# Patient Record
Sex: Female | Born: 1945 | Race: White | Hispanic: No | Marital: Married | Smoking: Never smoker
Health system: Southern US, Community
[De-identification: ages and names within clinical notes are randomized; demographics above are authoritative.]

## PROBLEM LIST (undated history)

## (undated) DIAGNOSIS — C4491 Basal cell carcinoma of skin, unspecified: Secondary | ICD-10-CM

## (undated) DIAGNOSIS — G709 Myoneural disorder, unspecified: Secondary | ICD-10-CM

## (undated) DIAGNOSIS — I10 Essential (primary) hypertension: Secondary | ICD-10-CM

## (undated) DIAGNOSIS — Z7982 Long term (current) use of aspirin: Secondary | ICD-10-CM

## (undated) DIAGNOSIS — M4800 Spinal stenosis, site unspecified: Secondary | ICD-10-CM

## (undated) DIAGNOSIS — D649 Anemia, unspecified: Secondary | ICD-10-CM

## (undated) DIAGNOSIS — C801 Malignant (primary) neoplasm, unspecified: Secondary | ICD-10-CM

## (undated) DIAGNOSIS — E7800 Pure hypercholesterolemia, unspecified: Secondary | ICD-10-CM

## (undated) DIAGNOSIS — E119 Type 2 diabetes mellitus without complications: Secondary | ICD-10-CM

## (undated) DIAGNOSIS — N189 Chronic kidney disease, unspecified: Secondary | ICD-10-CM

## (undated) DIAGNOSIS — M1990 Unspecified osteoarthritis, unspecified site: Secondary | ICD-10-CM

## (undated) DIAGNOSIS — K219 Gastro-esophageal reflux disease without esophagitis: Secondary | ICD-10-CM

## (undated) HISTORY — PX: KNEE ARTHROSCOPY: SHX127

## (undated) HISTORY — PX: ESOPHAGOGASTRODUODENOSCOPY: SHX1529

## (undated) HISTORY — PX: KNEE ARTHROPLASTY: SHX992

## (undated) HISTORY — PX: COLONOSCOPY: SHX174

## (undated) HISTORY — PX: APPENDECTOMY: SHX54

## (undated) HISTORY — PX: TOTAL KNEE ARTHROPLASTY: SHX125

## (undated) HISTORY — PX: SHOULDER ARTHROSCOPY WITH ROTATOR CUFF REPAIR: SHX5685

---

## 2015-01-22 ENCOUNTER — Ambulatory Visit: Payer: Self-pay | Admitting: Physician Assistant

## 2015-08-11 ENCOUNTER — Encounter: Payer: Self-pay | Admitting: Gynecology

## 2015-08-11 ENCOUNTER — Ambulatory Visit
Admission: EM | Admit: 2015-08-11 | Discharge: 2015-08-11 | Disposition: A | Discharge status: Home / Self Care | Payer: Medicare Other | Attending: Internal Medicine | Admitting: Internal Medicine

## 2015-08-11 DIAGNOSIS — I10 Essential (primary) hypertension: Secondary | ICD-10-CM | POA: Insufficient documentation

## 2015-08-11 DIAGNOSIS — N3001 Acute cystitis with hematuria: Secondary | ICD-10-CM | POA: Diagnosis not present

## 2015-08-11 DIAGNOSIS — Z7982 Long term (current) use of aspirin: Secondary | ICD-10-CM | POA: Insufficient documentation

## 2015-08-11 DIAGNOSIS — R3915 Urgency of urination: Secondary | ICD-10-CM | POA: Diagnosis present

## 2015-08-11 DIAGNOSIS — Z79899 Other long term (current) drug therapy: Secondary | ICD-10-CM | POA: Diagnosis not present

## 2015-08-11 DIAGNOSIS — E119 Type 2 diabetes mellitus without complications: Secondary | ICD-10-CM | POA: Diagnosis not present

## 2015-08-11 DIAGNOSIS — E7800 Pure hypercholesterolemia, unspecified: Secondary | ICD-10-CM | POA: Insufficient documentation

## 2015-08-11 DIAGNOSIS — K219 Gastro-esophageal reflux disease without esophagitis: Secondary | ICD-10-CM | POA: Insufficient documentation

## 2015-08-11 HISTORY — DX: Gastro-esophageal reflux disease without esophagitis: K21.9

## 2015-08-11 HISTORY — DX: Essential (primary) hypertension: I10

## 2015-08-11 HISTORY — DX: Unspecified osteoarthritis, unspecified site: M19.90

## 2015-08-11 HISTORY — DX: Type 2 diabetes mellitus without complications: E11.9

## 2015-08-11 HISTORY — DX: Pure hypercholesterolemia, unspecified: E78.00

## 2015-08-11 LAB — URINALYSIS COMPLETE WITH MICROSCOPIC (ARMC ONLY)
PH: 0 — AB (ref 5.0–8.0)
Squamous Epithelial / LPF: NONE SEEN — AB

## 2015-08-11 MED ORDER — NITROFURANTOIN MONOHYD MACRO 100 MG PO CAPS: 100.0000 mg | ORAL_CAPSULE | Freq: Two times a day (BID) | ORAL | Status: DC

## 2015-08-11 MED ORDER — PHENAZOPYRIDINE HCL 200 MG PO TABS: 200.0000 mg | ORAL_TABLET | Freq: Three times a day (TID) | ORAL | Status: DC

## 2015-08-11 NOTE — ED Notes (Signed)
Urinating blood, has had small clots last night. Painful urination since yesterday. No fever.

## 2015-08-11 NOTE — ED Provider Notes (Signed)
CSN: [id number]     Arrival date & time 08/11/15  0804 History   First MD Initiated Contact with Patient 08/11/15 [phone]     Chief Complaint  Patient presents with  . Urinary Tract Infection    Patient is a 69 y.o. female presenting with urinary tract infection.  Urinary Tract Infection  she reports that she has had urinary tract infections in the past, most recently in May 2015. She had the onset last evening of urinary urgency, frequency, burning. She was very uncomfortable, but was able to take a dose of over-the-counter Pyridium and get some rest. No fever, no nausea/vomiting/diarrhea. Appetite is okay. No unusual vaginal discharge or bleeding. No malaise.   Past Medical History  Diagnosis Date  . Diabetes mellitus without complication ([place])   . Hypertension   . Hypercholesteremia   . GERD (gastroesophageal reflux disease)   . Arthritis    Past Surgical History  Procedure Laterality Date  . Appendectomy    . Knee arthroplasty      right  . Total knee arthroplasty    . Shoulder arthroscopy with rotator cuff repair      right   Family history: Diabetes, hypertension  Social History  patient is a retired Producer, television/film/video, worked in Building surveyor for 35 years   Substance Use Topics  . Smoking status: Never Smoker   . Smokeless tobacco: None  . Alcohol Use: Yes    Review of Systems  All other systems reviewed and are negative.   Allergies  Dilaudid  Home Medications   Prior to Admission medications   Medication Sig Start Date End Date Taking? Authorizing Provider  aspirin 81 MG tablet Take 81 mg by mouth daily.   Yes Historical Provider, MD  calcium-vitamin D (OSCAL WITH D) 500-200 MG-UNIT tablet Take 1 tablet by mouth.   Yes Historical Provider, MD  co-enzyme Q-10 30 MG capsule Take 30 mg by mouth 3 (three) times daily.   Yes Historical Provider, MD  esomeprazole (NEXIUM) 40 MG capsule Take 40 mg by mouth daily at 12 noon.   Yes Historical Provider, MD  glucosamine-chondroitin  500-400 MG tablet Take 1 tablet by mouth 3 (three) times daily.   Yes Historical Provider, MD  Liraglutide (VICTOZA) 18 MG/3ML SOPN Inject into the skin.   Yes Historical Provider, MD  lisinopril (PRINIVIL,ZESTRIL) 40 MG tablet Take 40 mg by mouth daily.   Yes Historical Provider, MD  lovastatin (MEVACOR) 20 MG tablet Take 20 mg by mouth at bedtime.   Yes Historical Provider, MD  metFORMIN (GLUCOPHAGE) 1000 MG tablet Take 1,000 mg by mouth 2 (two) times daily with a meal.   Yes Historical Provider, MD  sitaGLIPtin (JANUVIA) 100 MG tablet Take 100 mg by mouth daily.   Yes Historical Provider, MD  vitamin B-12 (CYANOCOBALAMIN) 100 MCG tablet Take 100 mcg by mouth daily.   Yes Historical Provider, MD  nitrofurantoin, macrocrystal-monohydrate, (MACROBID) 100 MG capsule Take 1 capsule (100 mg total) by mouth 2 (two) times daily. 08/11/15   Sherlene Shams, MD  phenazopyridine (PYRIDIUM) 200 MG tablet Take 1 tablet (200 mg total) by mouth 3 (three) times daily. 08/11/15   Sherlene Shams, MD   Meds Ordered and Administered this Visit  Medications - No data to display  BP 150/73 mmHg  Pulse 96  Temp(Src) 98 F (36.7 C) (Oral)  Ht 5\' 3"  (1.6 m)  Wt 230 lb (104.327 kg)  BMI 40.75 kg/m2  SpO2 97% No data found.  Physical Exam  Constitutional: She is oriented to person, place, and time. No distress.  Alert, nicely groomed  HENT:  Head: Atraumatic.  Eyes:  Conjugate gaze, no eye redness/drainage  Neck: Neck supple.  Cardiovascular: Normal rate.   Pulmonary/Chest: No respiratory distress.  Abdominal: She exhibits no distension.  Musculoskeletal: She exhibits no edema.  No leg swelling  Neurological: She is alert and oriented to person, place, and time.  Skin: Skin is warm and dry.  Pink. No cyanosis  Nursing note and vitals reviewed.   ED Course  Procedures (including critical care time)  Results for orders placed or performed during the hospital encounter of 08/11/15  Urinalysis  complete, with microscopic  Result Value Ref Range   Color, Urine ORANGE (A) YELLOW   APPearance CLOUDY (A) CLEAR   Glucose, UA (A) NEGATIVE mg/dL    TEST NOT REPORTED DUE TO COLOR INTERFERENCE OF URINE PIGMENT   Bilirubin Urine (A) NEGATIVE    TEST NOT REPORTED DUE TO COLOR INTERFERENCE OF URINE PIGMENT   Ketones, ur (A) NEGATIVE mg/dL    TEST NOT REPORTED DUE TO COLOR INTERFERENCE OF URINE PIGMENT   Specific Gravity, Urine  1.005 - 1.030    TEST NOT REPORTED DUE TO COLOR INTERFERENCE OF URINE PIGMENT   Hgb urine dipstick (A) NEGATIVE    TEST NOT REPORTED DUE TO COLOR INTERFERENCE OF URINE PIGMENT   pH 0.0 (L) 5.0 - 8.0   Protein, ur (A) NEGATIVE mg/dL    TEST NOT REPORTED DUE TO COLOR INTERFERENCE OF URINE PIGMENT   Nitrite (A) NEGATIVE    TEST NOT REPORTED DUE TO COLOR INTERFERENCE OF URINE PIGMENT   Leukocytes, UA (A) NEGATIVE    TEST NOT REPORTED DUE TO COLOR INTERFERENCE OF URINE PIGMENT   RBC / HPF TOO NUMEROUS TO COUNT <3 RBC/hpf   WBC, UA TOO NUMEROUS TO COUNT <3 WBC/hpf   Bacteria, UA FEW (A) RARE   Squamous Epithelial / LPF NONE SEEN (A) RARE    urine culture is pending  MDM   1. Acute hemorrhagic cystitis    Discharge Medication List as of 08/11/2015  8:46 AM    START taking these medications   Details  nitrofurantoin, macrocrystal-monohydrate, (MACROBID) 100 MG capsule Take 1 capsule (100 mg total) by mouth 2 (two) times daily., Starting 08/11/2015, Until Discontinued, Normal    phenazopyridine (PYRIDIUM) 200 MG tablet Take 1 tablet (200 mg total) by mouth 3 (three) times daily., Starting 08/11/2015, Until Discontinued, Normal       [facility] [place]. Recheck or follow up PCP/Dr. Hoy Morn for persistent or worsening symptoms.    Sherlene Shams, MD 08/11/15 [phone]

## 2015-08-11 NOTE — Discharge Instructions (Signed)
Urine culture is pending; urinalysis suggests a UTI. Prescriptions for macrobid (nitrofurantoin, an antibiotic) and pyridium (phenazopyridine) were [facility] [place].

## 2015-08-15 LAB — URINE CULTURE: Culture: >=100000

## 2018-01-12 ENCOUNTER — Other Ambulatory Visit: Payer: Self-pay | Admitting: Family Medicine

## 2018-01-12 DIAGNOSIS — Z780 Asymptomatic menopausal state: Secondary | ICD-10-CM

## 2021-07-11 ENCOUNTER — Other Ambulatory Visit: Payer: Self-pay | Admitting: Physical Medicine & Rehabilitation

## 2021-07-11 ENCOUNTER — Other Ambulatory Visit (HOSPITAL_COMMUNITY): Payer: Self-pay | Admitting: Physical Medicine & Rehabilitation

## 2021-07-11 DIAGNOSIS — G8929 Other chronic pain: Secondary | ICD-10-CM

## 2021-07-11 DIAGNOSIS — M5441 Lumbago with sciatica, right side: Secondary | ICD-10-CM

## 2021-07-23 ENCOUNTER — Other Ambulatory Visit: Payer: Self-pay

## 2021-07-23 ENCOUNTER — Ambulatory Visit
Admission: RE | Admit: 2021-07-23 | Discharge: 2021-07-23 | Disposition: A | Discharge status: Home / Self Care | Payer: Medicare Other | Source: Ambulatory Visit | Attending: Physical Medicine & Rehabilitation | Admitting: Physical Medicine & Rehabilitation

## 2021-07-23 DIAGNOSIS — G8929 Other chronic pain: Secondary | ICD-10-CM | POA: Diagnosis present

## 2021-07-23 DIAGNOSIS — M5441 Lumbago with sciatica, right side: Secondary | ICD-10-CM | POA: Insufficient documentation

## 2021-07-23 DIAGNOSIS — M5442 Lumbago with sciatica, left side: Secondary | ICD-10-CM | POA: Diagnosis present

## 2021-11-20 ENCOUNTER — Other Ambulatory Visit: Payer: Self-pay | Admitting: Neurosurgery

## 2021-11-20 DIAGNOSIS — Z01818 Encounter for other preprocedural examination: Secondary | ICD-10-CM

## 2021-11-29 ENCOUNTER — Encounter
Admission: RE | Admit: 2021-11-29 | Discharge: 2021-11-29 | Disposition: A | Discharge status: Home / Self Care | Payer: Medicare Other | Source: Ambulatory Visit | Attending: Neurosurgery | Admitting: Neurosurgery

## 2021-11-29 ENCOUNTER — Other Ambulatory Visit: Payer: Self-pay

## 2021-11-29 DIAGNOSIS — Z01818 Encounter for other preprocedural examination: Secondary | ICD-10-CM | POA: Diagnosis not present

## 2021-11-29 HISTORY — DX: Chronic kidney disease, unspecified: N18.9

## 2021-11-29 HISTORY — DX: Myoneural disorder, unspecified: G70.9

## 2021-11-29 HISTORY — DX: Anemia, unspecified: D64.9

## 2021-11-29 HISTORY — DX: Malignant (primary) neoplasm, unspecified: C80.1

## 2021-11-29 LAB — CBC
HCT: 39.4 % (ref 36.0–46.0)
Hemoglobin: 13.4 g/dL (ref 12.0–15.0)
MCH: 29.1 pg (ref 26.0–34.0)
MCHC: 34 g/dL (ref 30.0–36.0)
MCV: 85.5 fL (ref 80.0–100.0)
Platelets: 275 10*3/uL (ref 150–400)
RBC: 4.61 MIL/uL (ref 3.87–5.11)
RDW: 13 % (ref 11.5–15.5)
WBC: 9.2 10*3/uL (ref 4.0–10.5)
nRBC: 0 % (ref 0.0–0.2)

## 2021-11-29 LAB — URINALYSIS, ROUTINE W REFLEX MICROSCOPIC
Bilirubin Urine: NEGATIVE
Glucose, UA: NEGATIVE mg/dL
Hgb urine dipstick: NEGATIVE
Ketones, ur: NEGATIVE mg/dL
Nitrite: NEGATIVE
Protein, ur: NEGATIVE mg/dL
Specific Gravity, Urine: 1.019 (ref 1.005–1.030)
pH: 5 (ref 5.0–8.0)

## 2021-11-29 LAB — BASIC METABOLIC PANEL
Anion gap: 9 (ref 5–15)
BUN: 21 mg/dL (ref 8–23)
CO2: 26 mmol/L (ref 22–32)
Calcium: 10.1 mg/dL (ref 8.9–10.3)
Chloride: 102 mmol/L (ref 98–111)
Creatinine, Ser: 0.85 mg/dL (ref 0.44–1.00)
GFR, Estimated: >60 mL/min (ref >60)
Glucose, Bld: 201 mg/dL — ABNORMAL HIGH (ref 70–99)
Potassium: 4.1 mmol/L (ref 3.5–5.1)
Sodium: 137 mmol/L (ref 135–145)

## 2021-11-29 LAB — SURGICAL PCR SCREEN
MRSA, PCR: NEGATIVE
Staphylococcus aureus: NEGATIVE

## 2021-11-29 LAB — TYPE AND SCREEN
ABO/RH(D): A POS
Antibody Screen: NEGATIVE

## 2021-11-29 LAB — APTT: aPTT: 27 seconds (ref 24–36)

## 2021-11-29 LAB — PROTIME-INR
INR: 1 (ref 0.8–1.2)
Prothrombin Time: 13 seconds (ref 11.4–15.2)

## 2021-11-29 NOTE — Patient Instructions (Addendum)
Your procedure is scheduled on: 12/13/21 - Friday Report to the Registration Desk on the 1st floor of the [facility]. To find out your arrival time, please call [phone] between 1PM - 3PM on: 12/12/21 - Thursday  REMEMBER: Instructions that are not followed completely may result in serious medical risk, up to and including death; or upon the discretion of your surgeon and anesthesiologist your surgery may need to be rescheduled.  Do not eat food after midnight the night before surgery.  No gum chewing, lozengers or hard candies.  You may however, drink CLEAR liquids up to 2 hours before you are scheduled to arrive for your surgery. Do not drink anything within 2 hours of your scheduled arrival time.  Clear liquids include: - water  Type 1 and Type 2 diabetics should only drink water.  TAKE THESE MEDICATIONS THE MORNING OF SURGERY WITH A SIP OF WATER:   - famotidine (PEPCID) 10 MG tablet- take 1 tablet the night before surgery and take 1 tablet the morning of surgery.  Follow recommendations from Cardiologist, Pulmonologist or PCP regarding stopping Aspirin, Coumadin, Plavix, Eliquis, Pradaxa, or Pletal. Stop taking Aspirin 81 mg beginning 12/06/21, may resume 2 weeks after your surgery.  Do Not take MetFORMIN on 02/01, 02/02, and do not take the day of surgery.  One week prior to surgery: Stop Anti-inflammatories (NSAIDS) such as Advil, Aleve, Ibuprofen, Motrin, Naproxen, Naprosyn and Aspirin based products such as Excedrin, Goodys Powder, BC Powder.  Stop ANY OVER THE COUNTER supplements until after surgery. Beginning 12/06/21  You may however, continue to take Tylenol if needed for pain up until the day of surgery.  No Alcohol for 24 hours before or after surgery.  No Smoking including e-cigarettes for 24 hours prior to surgery.  No chewable tobacco products for at least 6 hours prior to surgery.  No nicotine patches on the day of surgery.  Do not use any "recreational"  drugs for at least a week prior to your surgery.  Please be advised that the combination of cocaine and anesthesia may have negative outcomes, up to and including death. If you test positive for cocaine, your surgery will be cancelled.  On the morning of surgery brush your teeth with toothpaste and water, you may rinse your mouth with mouthwash if you wish. Do not swallow any toothpaste or mouthwash.  Use CHG Soap or wipes as directed on instruction sheet.  Do not wear jewelry, make-up, hairpins, clips or nail polish.  Do not wear lotions, powders, or perfumes.   Do not shave body from the neck down 48 hours prior to surgery just in case you cut yourself which could leave a site for infection.  Also, freshly shaved skin may become irritated if using the CHG soap.  Contact lenses, hearing aids and dentures may not be worn into surgery.  Do not bring valuables to the hospital. [facility] is not responsible for any missing/lost belongings or valuables.   Notify your doctor if there is any change in your medical condition (cold, fever, infection).  Wear comfortable clothing (specific to your surgery type) to the hospital.  After surgery, you can help prevent lung complications by doing breathing exercises.  Take deep breaths and cough every 1-2 hours. Your doctor may order a device called an Incentive Spirometer to help you take deep breaths. When coughing or sneezing, hold a pillow firmly against your incision with both hands. This is called splinting. Doing this helps protect your incision. It also  decreases belly discomfort.  If you are being admitted to the hospital overnight, leave your suitcase in the car. After surgery it may be brought to your room.  If you are being discharged the day of surgery, you will not be allowed to drive home. You will need a responsible adult (18 years or older) to drive you home and stay with you that night.   If you are taking public  transportation, you will need to have a responsible adult (18 years or older) with you. Please confirm with your physician that it is acceptable to use public transportation.   Please call the [facility]. at [phone] if you have any questions about these instructions.  Surgery Visitation Policy:  Patients undergoing a surgery or procedure may have one family member or support person with them as long as that person is not COVID-19 positive or experiencing its symptoms.  That person may remain in the waiting area during the procedure and may rotate out with other people.  Inpatient Visitation:    Visiting hours are 7 a.m. to 8 p.m. Up to two visitors ages 16+ are allowed at one time in a patient room. The visitors may rotate out with other people during the day. Visitors must check out when they leave, or other visitors will not be allowed. One designated support person may remain overnight. The visitor must pass COVID-19 screenings, use hand sanitizer when entering and exiting the patients room and wear a mask at all times, including in the patients room. Patients must also wear a mask when staff or their visitor are in the room. Masking is required regardless of vaccination status.

## 2021-12-13 ENCOUNTER — Encounter: Payer: Self-pay | Admitting: Neurosurgery

## 2021-12-13 ENCOUNTER — Observation Stay
Admission: RE | Admit: 2021-12-13 | Discharge: 2021-12-14 | Disposition: A | Discharge status: Home / Self Care | Payer: Medicare Other | Source: Ambulatory Visit | Attending: Neurosurgery | Admitting: Neurosurgery

## 2021-12-13 ENCOUNTER — Ambulatory Visit: Payer: Medicare Other

## 2021-12-13 ENCOUNTER — Other Ambulatory Visit: Payer: Self-pay

## 2021-12-13 ENCOUNTER — Ambulatory Visit: Payer: Medicare Other | Admitting: Anesthesiology

## 2021-12-13 ENCOUNTER — Ambulatory Visit: Payer: Medicare Other | Admitting: Urgent Care

## 2021-12-13 ENCOUNTER — Encounter
Admission: RE | Disposition: A | Discharge status: Home / Self Care | Payer: Self-pay | Source: Ambulatory Visit | Attending: Neurosurgery

## 2021-12-13 DIAGNOSIS — Z91040 Latex allergy status: Secondary | ICD-10-CM | POA: Insufficient documentation

## 2021-12-13 DIAGNOSIS — M48062 Spinal stenosis, lumbar region with neurogenic claudication: Secondary | ICD-10-CM | POA: Diagnosis present

## 2021-12-13 DIAGNOSIS — E119 Type 2 diabetes mellitus without complications: Secondary | ICD-10-CM | POA: Diagnosis not present

## 2021-12-13 DIAGNOSIS — M5416 Radiculopathy, lumbar region: Secondary | ICD-10-CM | POA: Diagnosis present

## 2021-12-13 DIAGNOSIS — Z419 Encounter for procedure for purposes other than remedying health state, unspecified: Secondary | ICD-10-CM

## 2021-12-13 HISTORY — PX: LUMBAR LAMINECTOMY/DECOMPRESSION MICRODISCECTOMY: SHX5026

## 2021-12-13 LAB — GLUCOSE, CAPILLARY
Glucose-Capillary: 154 mg/dL — ABNORMAL HIGH (ref 70–99)
Glucose-Capillary: 123 mg/dL — ABNORMAL HIGH (ref 70–99)

## 2021-12-13 LAB — ABO/RH: ABO/RH(D): A POS

## 2021-12-13 SURGERY — LUMBAR LAMINECTOMY/DECOMPRESSION MICRODISCECTOMY 2 LEVELS
Anesthesia: General | Site: Spine Lumbar

## 2021-12-13 MED ORDER — BUPIVACAINE LIPOSOME 1.3 % IJ SUSP
INTRAMUSCULAR | Status: AC
  Filled 2021-12-13: qty 20

## 2021-12-13 MED ORDER — SURGIFLO WITH THROMBIN (HEMOSTATIC MATRIX KIT) OPTIME
TOPICAL | Status: DC | PRN
  Administered 2021-12-13: 1 via TOPICAL

## 2021-12-13 MED ORDER — LIDOCAINE HCL (PF) 2 % IJ SOLN
INTRAMUSCULAR | Status: AC
  Filled 2021-12-13: qty 5

## 2021-12-13 MED ORDER — SODIUM CHLORIDE 0.9% FLUSH
3.0000 mL | Freq: Two times a day (BID) | INTRAVENOUS | Status: DC
  Administered 2021-12-13 – 2021-12-14 (×3): 3 mL via INTRAVENOUS

## 2021-12-13 MED ORDER — PHENOL 1.4 % MT LIQD
1.0000 | OROMUCOSAL | Status: DC | PRN
  Filled 2021-12-13: qty 177

## 2021-12-13 MED ORDER — SODIUM CHLORIDE 0.9 % IV SOLN: INTRAVENOUS | Status: DC

## 2021-12-13 MED ORDER — CHLORHEXIDINE GLUCONATE 0.12 % MT SOLN
OROMUCOSAL | Status: AC
  Administered 2021-12-13: 15 mL via OROMUCOSAL
  Filled 2021-12-13: qty 15

## 2021-12-13 MED ORDER — BUPIVACAINE HCL (PF) 0.5 % IJ SOLN
INTRAMUSCULAR | Status: AC
  Filled 2021-12-13: qty 30

## 2021-12-13 MED ORDER — BUPIVACAINE-EPINEPHRINE (PF) 0.5% -1:200000 IJ SOLN
INTRAMUSCULAR | Status: DC | PRN
  Administered 2021-12-13: 10 mL via PERINEURAL

## 2021-12-13 MED ORDER — FENTANYL CITRATE (PF) 100 MCG/2ML IJ SOLN
25.0000 ug | INTRAMUSCULAR | Status: DC | PRN
  Administered 2021-12-13 (×2): 25 ug via INTRAVENOUS

## 2021-12-13 MED ORDER — CEFAZOLIN SODIUM-DEXTROSE 2-4 GM/100ML-% IV SOLN
2.0000 g | INTRAVENOUS | Status: AC
  Administered 2021-12-13: 2 g via INTRAVENOUS

## 2021-12-13 MED ORDER — METHYLPREDNISOLONE ACETATE 40 MG/ML IJ SUSP
INTRAMUSCULAR | Status: DC | PRN
Start: 2021-12-13 — End: 2021-12-13
  Administered 2021-12-13: 40 mg

## 2021-12-13 MED ORDER — ACETAMINOPHEN 10 MG/ML IV SOLN
INTRAVENOUS | Status: AC
  Filled 2021-12-13: qty 100

## 2021-12-13 MED ORDER — PHENYLEPHRINE HCL (PRESSORS) 10 MG/ML IV SOLN
INTRAVENOUS | Status: DC | PRN
  Administered 2021-12-13 (×2): 80 ug via INTRAVENOUS
  Administered 2021-12-13: 160 ug via INTRAVENOUS
  Administered 2021-12-13 (×2): 80 ug via INTRAVENOUS

## 2021-12-13 MED ORDER — ONDANSETRON HCL 4 MG PO TABS: 4.0000 mg | ORAL_TABLET | Freq: Four times a day (QID) | ORAL | Status: DC | PRN

## 2021-12-13 MED ORDER — ONDANSETRON HCL 4 MG/2ML IJ SOLN
INTRAMUSCULAR | Status: AC
  Filled 2021-12-13: qty 2

## 2021-12-13 MED ORDER — MENTHOL 3 MG MT LOZG
1.0000 | LOZENGE | OROMUCOSAL | Status: DC | PRN
  Filled 2021-12-13: qty 9

## 2021-12-13 MED ORDER — METFORMIN HCL ER 500 MG PO TB24
1000.0000 mg | ORAL_TABLET | Freq: Two times a day (BID) | ORAL | Status: DC
  Administered 2021-12-13 – 2021-12-14 (×2): 1000 mg via ORAL
  Filled 2021-12-13 (×4): qty 2

## 2021-12-13 MED ORDER — OXYCODONE HCL 5 MG PO TABS
10.0000 mg | ORAL_TABLET | ORAL | Status: DC | PRN
  Administered 2021-12-13: 10 mg via ORAL
  Filled 2021-12-13: qty 2

## 2021-12-13 MED ORDER — 0.9 % SODIUM CHLORIDE (POUR BTL) OPTIME
TOPICAL | Status: DC | PRN
  Administered 2021-12-13: 100 mL

## 2021-12-13 MED ORDER — SUCCINYLCHOLINE CHLORIDE 200 MG/10ML IV SOSY
PREFILLED_SYRINGE | INTRAVENOUS | Status: DC | PRN
Start: 2021-12-13 — End: 2021-12-13
  Administered 2021-12-13: 100 mg via INTRAVENOUS

## 2021-12-13 MED ORDER — ACETAMINOPHEN 10 MG/ML IV SOLN: 1000.0000 mg | Freq: Once | INTRAVENOUS | Status: DC | PRN

## 2021-12-13 MED ORDER — CHLORHEXIDINE GLUCONATE 0.12 % MT SOLN
15.0000 mL | Freq: Once | OROMUCOSAL | Status: AC
Start: 2021-12-13 — End: 2021-12-13

## 2021-12-13 MED ORDER — OXYCODONE HCL 5 MG PO TABS
5.0000 mg | ORAL_TABLET | ORAL | Status: DC | PRN
  Administered 2021-12-13 – 2021-12-14 (×3): 5 mg via ORAL
  Filled 2021-12-13 (×4): qty 1

## 2021-12-13 MED ORDER — FENTANYL CITRATE (PF) 100 MCG/2ML IJ SOLN
INTRAMUSCULAR | Status: AC
  Filled 2021-12-13: qty 2

## 2021-12-13 MED ORDER — GLIMEPIRIDE 4 MG PO TABS
4.0000 mg | ORAL_TABLET | Freq: Every day | ORAL | Status: DC | PRN
  Filled 2021-12-13: qty 1

## 2021-12-13 MED ORDER — PHENYLEPHRINE HCL-NACL 20-0.9 MG/250ML-% IV SOLN
INTRAVENOUS | Status: AC
  Filled 2021-12-13: qty 250

## 2021-12-13 MED ORDER — REMIFENTANIL HCL 1 MG IV SOLR
INTRAVENOUS | Status: DC | PRN
  Administered 2021-12-13: .2 ug/kg/min via INTRAVENOUS

## 2021-12-13 MED ORDER — REMIFENTANIL HCL 1 MG IV SOLR
INTRAVENOUS | Status: AC
  Filled 2021-12-13: qty 1000

## 2021-12-13 MED ORDER — B COMPLEX-C PO TABS
1.0000 | ORAL_TABLET | Freq: Every day | ORAL | Status: DC
  Administered 2021-12-13 – 2021-12-14 (×2): 1 via ORAL
  Filled 2021-12-13 (×2): qty 1

## 2021-12-13 MED ORDER — SODIUM CHLORIDE 0.9% FLUSH: 3.0000 mL | INTRAVENOUS | Status: DC | PRN

## 2021-12-13 MED ORDER — SODIUM CHLORIDE FLUSH 0.9 % IV SOLN
INTRAVENOUS | Status: AC
  Filled 2021-12-13: qty 20

## 2021-12-13 MED ORDER — OXYCODONE HCL 5 MG/5ML PO SOLN: 5.0000 mg | Freq: Once | ORAL | Status: DC | PRN

## 2021-12-13 MED ORDER — SENNA 8.6 MG PO TABS
1.0000 | ORAL_TABLET | Freq: Two times a day (BID) | ORAL | Status: DC
  Administered 2021-12-13 – 2021-12-14 (×3): 8.6 mg via ORAL
  Filled 2021-12-13 (×3): qty 1

## 2021-12-13 MED ORDER — MORPHINE SULFATE (PF) 2 MG/ML IV SOLN
2.0000 mg | INTRAVENOUS | Status: DC | PRN
  Filled 2021-12-13: qty 1

## 2021-12-13 MED ORDER — LISINOPRIL 10 MG PO TABS
10.0000 mg | ORAL_TABLET | Freq: Every day | ORAL | Status: DC
  Administered 2021-12-13 – 2021-12-14 (×2): 10 mg via ORAL
  Filled 2021-12-13 (×2): qty 1

## 2021-12-13 MED ORDER — MIDAZOLAM HCL 2 MG/2ML IJ SOLN
INTRAMUSCULAR | Status: AC
  Filled 2021-12-13: qty 2

## 2021-12-13 MED ORDER — CELECOXIB 200 MG PO CAPS
200.0000 mg | ORAL_CAPSULE | Freq: Two times a day (BID) | ORAL | Status: DC
  Administered 2021-12-13 – 2021-12-14 (×3): 200 mg via ORAL
  Filled 2021-12-13 (×3): qty 1

## 2021-12-13 MED ORDER — ACETAMINOPHEN 10 MG/ML IV SOLN
INTRAVENOUS | Status: DC | PRN
  Administered 2021-12-13: 1000 mg via INTRAVENOUS

## 2021-12-13 MED ORDER — ONDANSETRON HCL 4 MG/2ML IJ SOLN
4.0000 mg | Freq: Four times a day (QID) | INTRAMUSCULAR | Status: DC | PRN
  Administered 2021-12-14: 4 mg via INTRAVENOUS
  Filled 2021-12-13: qty 2

## 2021-12-13 MED ORDER — PROPOFOL 500 MG/50ML IV EMUL
INTRAVENOUS | Status: DC | PRN
  Administered 2021-12-13: 125 ug/kg/min via INTRAVENOUS

## 2021-12-13 MED ORDER — SUCCINYLCHOLINE CHLORIDE 200 MG/10ML IV SOSY
PREFILLED_SYRINGE | INTRAVENOUS | Status: AC
  Filled 2021-12-13: qty 10

## 2021-12-13 MED ORDER — BISACODYL 5 MG PO TBEC
5.0000 mg | DELAYED_RELEASE_TABLET | Freq: Every day | ORAL | Status: DC | PRN
  Filled 2021-12-13: qty 1

## 2021-12-13 MED ORDER — CEFAZOLIN SODIUM-DEXTROSE 2-4 GM/100ML-% IV SOLN
INTRAVENOUS | Status: AC
  Filled 2021-12-13: qty 100

## 2021-12-13 MED ORDER — BUPIVACAINE-EPINEPHRINE (PF) 0.5% -1:200000 IJ SOLN
INTRAMUSCULAR | Status: AC
  Filled 2021-12-13: qty 30

## 2021-12-13 MED ORDER — ONDANSETRON HCL 4 MG/2ML IJ SOLN
INTRAMUSCULAR | Status: DC | PRN
  Administered 2021-12-13: 4 mg via INTRAVENOUS

## 2021-12-13 MED ORDER — PROMETHAZINE HCL 25 MG/ML IJ SOLN: 6.2500 mg | INTRAMUSCULAR | Status: DC | PRN

## 2021-12-13 MED ORDER — ACETAMINOPHEN 500 MG PO TABS
1000.0000 mg | ORAL_TABLET | Freq: Four times a day (QID) | ORAL | Status: AC
  Administered 2021-12-13 – 2021-12-14 (×3): 1000 mg via ORAL
  Filled 2021-12-13 (×3): qty 2

## 2021-12-13 MED ORDER — ACETYL L-CARNITINE 500 MG PO CAPS: 500.0000 mg | ORAL_CAPSULE | Freq: Every day | ORAL | Status: DC

## 2021-12-13 MED ORDER — DROPERIDOL 2.5 MG/ML IJ SOLN
0.6250 mg | Freq: Once | INTRAMUSCULAR | Status: DC | PRN
  Filled 2021-12-13: qty 2

## 2021-12-13 MED ORDER — POLYETHYLENE GLYCOL 3350 17 G PO PACK
17.0000 g | PACK | Freq: Every day | ORAL | Status: DC | PRN
  Filled 2021-12-13: qty 1

## 2021-12-13 MED ORDER — FLEET ENEMA 7-19 GM/118ML RE ENEM: 1.0000 | ENEMA | Freq: Once | RECTAL | Status: DC | PRN

## 2021-12-13 MED ORDER — SODIUM CHLORIDE (PF) 0.9 % IJ SOLN
INTRAMUSCULAR | Status: DC | PRN
  Administered 2021-12-13: 60 mL

## 2021-12-13 MED ORDER — PROPOFOL 1000 MG/100ML IV EMUL
INTRAVENOUS | Status: AC
  Filled 2021-12-13: qty 100

## 2021-12-13 MED ORDER — VITAMIN D 25 MCG (1000 UNIT) PO TABS
2000.0000 [IU] | ORAL_TABLET | Freq: Every day | ORAL | Status: DC
  Administered 2021-12-13 – 2021-12-14 (×2): 2000 [IU] via ORAL
  Filled 2021-12-13 (×2): qty 2

## 2021-12-13 MED ORDER — PROPOFOL 10 MG/ML IV BOLUS
INTRAVENOUS | Status: AC
  Filled 2021-12-13: qty 20

## 2021-12-13 MED ORDER — EPHEDRINE SULFATE (PRESSORS) 50 MG/ML IJ SOLN
INTRAMUSCULAR | Status: DC | PRN
  Administered 2021-12-13: 5 mg via INTRAVENOUS
  Administered 2021-12-13: 10 mg via INTRAVENOUS

## 2021-12-13 MED ORDER — FAMOTIDINE 20 MG PO TABS: 10.0000 mg | ORAL_TABLET | Freq: Every evening | ORAL | Status: DC | PRN

## 2021-12-13 MED ORDER — PHENYLEPHRINE HCL-NACL 20-0.9 MG/250ML-% IV SOLN
INTRAVENOUS | Status: DC | PRN
Start: 2021-12-13 — End: 2021-12-13
  Administered 2021-12-13: 40 ug/min via INTRAVENOUS

## 2021-12-13 MED ORDER — MIDAZOLAM HCL 2 MG/2ML IJ SOLN
INTRAMUSCULAR | Status: DC | PRN
  Administered 2021-12-13: 2 mg via INTRAVENOUS

## 2021-12-13 MED ORDER — OXYCODONE HCL 5 MG PO TABS: 5.0000 mg | ORAL_TABLET | Freq: Once | ORAL | Status: DC | PRN

## 2021-12-13 MED ORDER — PROPOFOL 10 MG/ML IV BOLUS
INTRAVENOUS | Status: DC | PRN
  Administered 2021-12-13: 150 mg via INTRAVENOUS

## 2021-12-13 MED ORDER — SODIUM CHLORIDE 0.9 % IV SOLN: 250.0000 mL | INTRAVENOUS | Status: DC

## 2021-12-13 MED ORDER — METHYLPREDNISOLONE ACETATE 40 MG/ML IJ SUSP
INTRAMUSCULAR | Status: AC
  Filled 2021-12-13: qty 1

## 2021-12-13 MED ORDER — SODIUM CHLORIDE (PF) 0.9 % IJ SOLN
INTRAMUSCULAR | Status: AC
  Filled 2021-12-13: qty 20

## 2021-12-13 MED ORDER — TIZANIDINE HCL 4 MG PO TABS
4.0000 mg | ORAL_TABLET | Freq: Three times a day (TID) | ORAL | Status: DC | PRN
  Filled 2021-12-13: qty 1

## 2021-12-13 MED ORDER — ORAL CARE MOUTH RINSE: 15.0000 mL | Freq: Once | OROMUCOSAL | Status: AC

## 2021-12-13 MED ORDER — FENTANYL CITRATE (PF) 100 MCG/2ML IJ SOLN
INTRAMUSCULAR | Status: DC | PRN
  Administered 2021-12-13: 50 ug via INTRAVENOUS

## 2021-12-13 SURGICAL SUPPLY — 54 items
BUR NEURO DRILL SOFT 3.0X3.8M (BURR) ×2 IMPLANT
CHLORAPREP W/TINT 26 (MISCELLANEOUS) ×2 IMPLANT
CNTNR SPEC 2.5X3XGRAD LEK (MISCELLANEOUS) ×1
CONT SPEC 4OZ STER OR WHT (MISCELLANEOUS) ×1
CONTAINER SPEC 2.5X3XGRAD LEK (MISCELLANEOUS) ×1 IMPLANT
COUNTER NEEDLE 20/40 LG (NEEDLE) ×2 IMPLANT
CUP MEDICINE 2OZ PLAST GRAD ST (MISCELLANEOUS) ×2 IMPLANT
DERMABOND ADVANCED (GAUZE/BANDAGES/DRESSINGS) ×1
DERMABOND ADVANCED .7 DNX12 (GAUZE/BANDAGES/DRESSINGS) ×1 IMPLANT
DRAPE C-ARM 42X72 X-RAY (DRAPES) ×1 IMPLANT
DRAPE C-ARM XRAY 36X54 (DRAPES) ×1 IMPLANT
DRAPE LAPAROTOMY 100X77 ABD (DRAPES) ×2 IMPLANT
DRAPE MICROSCOPE SPINE 48X150 (DRAPES) ×2 IMPLANT
DRAPE SURG 17X11 SM STRL (DRAPES) ×8 IMPLANT
DRSG OPSITE POSTOP 4X6 (GAUZE/BANDAGES/DRESSINGS) ×1 IMPLANT
DRSG TEGADERM 2-3/8X2-3/4 SM (GAUZE/BANDAGES/DRESSINGS) ×1 IMPLANT
ELECT CAUTERY BLADE TIP 2.5 (TIP) ×2
ELECT EZSTD 165MM 6.5IN (MISCELLANEOUS) ×2
ELECT REM PT RETURN 9FT ADLT (ELECTROSURGICAL) ×2
ELECTRODE CAUTERY BLDE TIP 2.5 (TIP) ×1 IMPLANT
ELECTRODE EZSTD 165MM 6.5IN (MISCELLANEOUS) ×1 IMPLANT
ELECTRODE REM PT RTRN 9FT ADLT (ELECTROSURGICAL) ×1 IMPLANT
GAUZE 4X4 16PLY ~~LOC~~+RFID DBL (SPONGE) ×2 IMPLANT
GLOVE SURG SYN 6.5 ES PF (GLOVE) ×2 IMPLANT
GLOVE SURG SYN 6.5 PF PI (GLOVE) ×1 IMPLANT
GLOVE SURG SYN 8.5  E (GLOVE) ×3
GLOVE SURG SYN 8.5 E (GLOVE) ×3 IMPLANT
GLOVE SURG SYN 8.5 PF PI (GLOVE) ×3 IMPLANT
GLOVE SURG UNDER POLY LF SZ6.5 (GLOVE) ×2 IMPLANT
GOWN SRG LRG LVL 4 IMPRV REINF (GOWNS) ×1 IMPLANT
GOWN SRG XL LVL 3 NONREINFORCE (GOWNS) ×1 IMPLANT
GOWN STRL NON-REIN TWL XL LVL3 (GOWNS) ×1
GOWN STRL REIN LRG LVL4 (GOWNS) ×1
GRADUATE 1200CC STRL 31836 (MISCELLANEOUS) ×2 IMPLANT
GRAFT DURAGEN MATRIX 1WX1L (Tissue) IMPLANT
KIT SPINAL PRONEVIEW (KITS) ×2 IMPLANT
MANIFOLD NEPTUNE II (INSTRUMENTS) ×2 IMPLANT
MARKER SKIN DUAL TIP RULER LAB (MISCELLANEOUS) ×3 IMPLANT
NDL SAFETY ECLIPSE 18X1.5 (NEEDLE) ×1 IMPLANT
NEEDLE HYPO 18GX1.5 SHARP (NEEDLE) ×1
NEEDLE HYPO 22GX1.5 SAFETY (NEEDLE) ×2 IMPLANT
NS IRRIG 500ML POUR BTL (IV SOLUTION) ×1 IMPLANT
PACK LAMINECTOMY NEURO (CUSTOM PROCEDURE TRAY) ×2 IMPLANT
SPONGE GAUZE 2X2 8PLY STRL LF (GAUZE/BANDAGES/DRESSINGS) ×1 IMPLANT
SURGIFLO W/THROMBIN 8M KIT (HEMOSTASIS) ×2 IMPLANT
SUT DVC VLOC 3-0 CL 6 P-12 (SUTURE) ×2 IMPLANT
SUT ETHILON 3 0 FSL (SUTURE) ×1 IMPLANT
SUT VIC AB 0 CT1 27 (SUTURE) ×1
SUT VIC AB 0 CT1 27XCR 8 STRN (SUTURE) ×1 IMPLANT
SUT VIC AB 2-0 CT1 18 (SUTURE) ×2 IMPLANT
SYR 30ML LL (SYRINGE) ×4 IMPLANT
SYR 3ML LL SCALE MARK (SYRINGE) ×2 IMPLANT
TOWEL OR 17X26 4PK STRL BLUE (TOWEL DISPOSABLE) ×5 IMPLANT
TUBING CONNECTING 10 (TUBING) ×2 IMPLANT

## 2021-12-13 NOTE — Plan of Care (Signed)
Patient admitted

## 2021-12-13 NOTE — Transfer of Care (Signed)
Immediate Anesthesia Transfer of Care Note  Patient: Julie Chang  Procedure(s) Performed: L3-5 POSTERIOR SPINAL DECOMPRESSION (Spine Lumbar)  Patient Location: PACU  Anesthesia Type:General  Level of Consciousness: awake, drowsy and patient cooperative  Airway & Oxygen Therapy: Patient Spontanous Breathing and Patient connected to face mask oxygen  Post-op Assessment: Report given to RN and Post -op Vital signs reviewed and stable  Post vital signs: Reviewed and stable  Last Vitals:  Vitals Value Taken Time  BP 124/72 12/13/21 1016  Temp 36.1 C 12/13/21 1012  Pulse 101 12/13/21 1016  Resp 18 12/13/21 1016  SpO2 100 % 12/13/21 1016    Last Pain:  Vitals:   12/13/21 1012  TempSrc:   PainSc: Asleep         Complications: No notable events documented.

## 2021-12-13 NOTE — Op Note (Signed)
Indications: Mrs. Miguez is a 76 yo female who presented with neurogenic claudication.  She failed conservative management.  Findings: severe stenosis  Preoperative Diagnosis: Lumbar Stenosis with neurogenic claudication Postoperative Diagnosis: same   EBL: 25 ml IVF: 500 ml Drains: 1 placed Disposition: Extubated and Stable to PACU Complications: none  No foley catheter was placed.   Preoperative Note:   Risks of surgery discussed include: infection, bleeding, stroke, coma, death, paralysis, CSF leak, nerve/spinal cord injury, numbness, tingling, weakness, complex regional pain syndrome, recurrent stenosis and/or disc herniation, vascular injury, development of instability, neck/back pain, need for further surgery, persistent symptoms, development of deformity, and the risks of anesthesia. The patient understood these risks and agreed to proceed.  Operative Note:   1. L3-5 lumbar decompression including central laminectomy and bilateral medial facetectomies including foraminotomies  The patient was then brought from the preoperative center with intravenous access established.  The patient underwent general anesthesia and endotracheal tube intubation, and was then rotated on the [place] rail top where all pressure points were appropriately padded.  The skin was then thoroughly cleansed.  Perioperative antibiotic prophylaxis was administered.  Sterile prep and drapes were then applied and a timeout was then observed.  C-arm was brought into the field under sterile conditions and under lateral visualization the L4-5 interspace was identified and marked.  The incision was marked and injected with local anesthetic. Once this was complete a 6 cm incision was opened with the use of a #10 blade knife.    The metrx tubes were sequentially advanced and confirmed in position at L4-5. An 74mm by 46mm tube was locked in place to the bed side attachment.  The microscope was then sterilely brought into  the field and muscle creep was hemostased with a bipolar and resected with a pituitary rongeur.  A Bovie extender was then used to expose the spinous process and lamina.  Careful attention was placed to not violate the facet capsule. A 3 mm matchstick drill bit was then used to make a hemi-laminotomy trough until the ligamentum flavum was exposed.  This was extended to the base of the spinous process and to the contralateral side to remove all the central bone from each side.  Once this was complete and the underlying ligamentum flavum was visualized, it was dissected with a curette and resected with Kerrison rongeurs.  Extensive ligamentum hypertrophy was noted, requiring a substantial amount of time and care for removal.  The dura was identified and palpated. The kerrison rongeur was then used to remove the medial facet bilaterally until no compression was noted.  A balltip probe was used to confirm decompression of the ipsilateral L5 nerve root.  Additional attention was paid to completion of the contralateral L4-5 foraminotomy until the contralateral traversing nerve root was completely free.  Once this was complete, L4-5 central decompression including medial facetectomy and foraminotomy was confirmed and decompression on both sides was confirmed. No CSF leak was noted.  A Depo-Medrol soaked Gelfoam pledget was placed in the defect.  The wound was copiously irrigated. The tube system was then removed under microscopic visualization and hemostasis was obtained with a bipolar.    After performing the decompression at L4-5, the metrx tubes were sequentially advanced and confirmed in position at L3-4. An 56mm by 2mm tube was locked in place to the bed side attachment.  Fluoroscopy was then removed from the field.  The microscope was then sterilely brought into the field and muscle creep was hemostased with a bipolar  and resected with a pituitary rongeur.  A Bovie extender was then used to expose the spinous  process and lamina.  Careful attention was placed to not violate the facet capsule. A 3 mm matchstick drill bit was then used to make a hemi-laminotomy trough until the ligamentum flavum was exposed.  This was extended to the base of the spinous process and to the contralateral side to remove all the central bone from each side.  Once this was complete and the underlying ligamentum flavum was visualized, it was dissected with a curette and resected with Kerrison rongeurs.  Extensive ligamentum hypertrophy was noted, requiring a substantial amount of time and care for removal.  The dura was identified and palpated. The kerrison rongeur was then used to remove the medial facet bilaterally until no compression was noted.  A balltip probe was used to confirm decompression of the ipsilateral L4 nerve root.  Additional attention was paid to completion of the contralateral foraminotomy until the contralateral L4 nerve root was completely free.  Once this was complete, L3-4 central decompression including medial facetectomy and foraminotomy was confirmed and decompression on both sides was confirmed. No CSF leak was noted.  A Depo-Medrol soaked Gelfoam pledget was placed in the defect.  The wound was copiously irrigated. The tube system was then removed under microscopic visualization and hemostasis was obtained with a bipolar.    Due to significant epidural oozing, a drain was placed.   The fascial layer was reapproximated with the use of a 0 Vicryl suture.  Subcutaneous tissue layer was reapproximated using 2-0 Vicryl suture.  3-0 monocryl was placed in subcuticular fashion. The skin was then cleansed and Dermabond was used to close the skin opening.  Patient was then rotated back to the preoperative bed awakened from anesthesia and taken to recovery all counts are correct in this case.  I performed the entire procedure with the assistance of Cooper Render PA as an Pensions consultant.  Masayo Fera K. Izora Ribas MD

## 2021-12-13 NOTE — Anesthesia Preprocedure Evaluation (Addendum)
Anesthesia Evaluation  Patient identified by MRN, date of birth, ID band Patient awake    Reviewed: Allergy & Precautions, NPO status , Patient's Chart, lab work & pertinent test results  Airway Mallampati: III  TM Distance: >3 FB Neck ROM: full    Dental no notable dental hx.    Pulmonary neg pulmonary ROS,    Pulmonary exam normal        Cardiovascular Exercise Tolerance: Poor hypertension, Pt. on medications Normal cardiovascular exam     Neuro/Psych  Neuromuscular disease (Neurogenic claudication due to lumbar spinal stenosis) negative psych ROS   GI/Hepatic Neg liver ROS, GERD  Controlled and Medicated,  Endo/Other  diabetes, Well Controlled, Type 2, Oral Hypoglycemic AgentsMorbid obesity  Renal/GU CRFRenal disease     Musculoskeletal  (+) Arthritis , Osteoarthritis,    Abdominal (+) + obese,   Peds  Hematology negative hematology ROS (+)   Anesthesia Other Findings Past Medical History: No date: Anemia No date: Arthritis No date: Cancer ([place])     Comment:  basal cell No date: Chronic kidney disease No date: Diabetes mellitus without complication ([facility]) No date: GERD (gastroesophageal reflux disease) No date: Hypercholesteremia No date: Hypertension No date: Neuromuscular disorder ([facility])     Comment:  spinal stenosis  Past Surgical History: No date: APPENDECTOMY No date: COLONOSCOPY No date: ESOPHAGOGASTRODUODENOSCOPY No date: KNEE ARTHROPLASTY     Comment:  right No date: SHOULDER ARTHROSCOPY WITH ROTATOR CUFF REPAIR     Comment:  right No date: TOTAL KNEE ARTHROPLASTY  BMI    Body Mass Index: 40.06 kg/m      Reproductive/Obstetrics negative OB ROS                            Anesthesia Physical Anesthesia Plan  ASA: 3  Anesthesia Plan: General ETT   Post-op Pain Management: Ofirmev IV (intra-op)   Induction: Intravenous  PONV Risk Score and Plan: 3 and  Ondansetron, Dexamethasone, Treatment may vary due to age or medical condition, TIVA and Propofol infusion  Airway Management Planned: Oral ETT  Additional Equipment:   Intra-op Plan:   Post-operative Plan: Extubation in OR  Informed Consent: I have reviewed the patients History and Physical, chart, labs and discussed the procedure including the risks, benefits and alternatives for the proposed anesthesia with the patient or authorized representative who has indicated his/her understanding and acceptance.     Dental Advisory Given  Plan Discussed with: Anesthesiologist, CRNA and Surgeon  Anesthesia Plan Comments:        Anesthesia Quick Evaluation

## 2021-12-13 NOTE — H&P (Signed)
I have reviewed and confirmed my history and physical from 11/19/2021 with no additions or changes. Plan for L3-5 decompression.  Risks and benefits reviewed.  Heart sounds normal no MRG. Chest Clear to Auscultation Bilaterally.

## 2021-12-13 NOTE — Discharge Instructions (Signed)
°  Your surgeon has performed an operation on your lumbar spine (low back) to relieve pressure on one or more nerves. Many times, patients feel better immediately after surgery and can overdo it. Even if you feel well, it is important that you follow these activity guidelines. If you do not let your back heal properly from the surgery, you can increase the chance of a disc herniation and/or return of your symptoms. The following are instructions to help in your recovery once you have been discharged from the hospital.   Activity    No bending, lifting, or twisting (BLT). Avoid lifting objects heavier than 10 pounds (gallon milk jug).  Where possible, avoid household activities that involve lifting, bending, pushing, or pulling such as laundry, vacuuming, grocery shopping, and childcare. Try to arrange for help from friends and family for these activities while your back heals.  Increase physical activity slowly as tolerated.  Taking short walks is encouraged, but avoid strenuous exercise. Do not jog, run, bicycle, lift weights, or participate in any other exercises unless specifically allowed by your doctor. Avoid prolonged sitting, including car rides.  Talk to your doctor before resuming sexual activity.  You should not drive until cleared by your doctor.  Until released by your doctor, you should not return to work or school.  You should rest at home and let your body heal.   You may shower two days after your surgery.  After showering, lightly dab your incision dry. Do not take a tub bath or go swimming for 3 weeks, or until approved by your doctor at your follow-up appointment.  If you smoke, we strongly recommend that you quit.  Smoking has been proven to interfere with normal healing in your back and will dramatically reduce the success rate of your surgery. Please contact [facility] (800-QUIT-NOW) and use the resources at www.[facility].com for assistance in stopping smoking.  Surgical  Incision   If you have a dressing on your incision, you may remove it three days after your surgery. Keep your incision area clean and dry.  Your incision was closed with Dermabond glue. The glue should begin to peel away within about a week. Diet            You may return to your usual diet. Be sure to stay hydrated.  When to Contact us  Although your surgery and recovery will likely be uneventful, you may have some residual numbness, aches, and pains in your back and/or legs. This is normal and should improve in the next few weeks.  However, should you experience any of the following, contact us immediately: New numbness or weakness Pain that is progressively getting worse, and is not relieved by your pain medications or rest Bleeding, redness, swelling, pain, or drainage from surgical incision Chills or flu-like symptoms Fever greater than 101.0 F (38.3 C) Problems with bowel or bladder functions Difficulty breathing or shortness of breath Warmth, tenderness, or swelling in your calf  Contact Information During office hours (Monday-Friday 9 am to 5 pm), please call your physician at [phone] After hours and weekends, please call [phone] and speak with the answering service, who will contact the doctor on call.  If that fails, call the [facility] Operator at [phone] and ask for the Neurosurgery Resident On Call  For a life-threatening emergency, call 911

## 2021-12-13 NOTE — Progress Notes (Signed)
Pt tolerated sitting on side of bed for lunch

## 2021-12-13 NOTE — Progress Notes (Signed)
PHARMACIST - PHYSICIAN ORDER COMMUNICATION  CONCERNING: P&T Medication Policy on Herbal Medications  DESCRIPTION:  This patients order for:  Acetyl L-Carnitine CAPS 500 mg  has been noted.  This product(s) is classified as an herbal or natural product. Due to a lack of definitive safety studies or FDA approval, nonstandard manufacturing practices, plus the potential risk of unknown drug-drug interactions while on inpatient medications, the Pharmacy and Therapeutics Committee does not permit the use of herbal or natural products of this type within [facility].   ACTION TAKEN: The pharmacy department is unable to verify this order at this time.  Please reevaluate patients clinical condition at discharge and address if the herbal or natural product(s) should be resumed at that time.  Pernell Dupre, PharmD, BCPS Clinical Pharmacist 12/13/2021 11:50 AM

## 2021-12-13 NOTE — Anesthesia Postprocedure Evaluation (Signed)
Anesthesia Post Note  Patient: Julie Chang  Procedure(s) Performed: L3-5 POSTERIOR SPINAL DECOMPRESSION (Spine Lumbar)  Patient location during evaluation: PACU Anesthesia Type: General Level of consciousness: awake and alert Pain management: pain level controlled Vital Signs Assessment: post-procedure vital signs reviewed and stable Respiratory status: spontaneous breathing, nonlabored ventilation and respiratory function stable Cardiovascular status: blood pressure returned to baseline and stable Postop Assessment: no apparent nausea or vomiting Anesthetic complications: no   No notable events documented.   Last Vitals:  Vitals:   12/13/21 1130 12/13/21 1141  BP: 133/81 112/72  Pulse: 83 87  Resp: 15 16  Temp:  (!) 35.1 C  SpO2: 95% 95%    Last Pain:  Vitals:   12/13/21 1320  TempSrc:   PainSc: [name]

## 2021-12-13 NOTE — Anesthesia Procedure Notes (Signed)
Procedure Name: Intubation Date/Time: 12/13/2021 7:42 AM Performed by: Debe Coder, CRNA Pre-anesthesia Checklist: Patient identified, Emergency Drugs available, Suction available and Patient being monitored Patient Re-evaluated:Patient Re-evaluated prior to induction Oxygen Delivery Method: Circle system utilized Preoxygenation: Pre-oxygenation with 100% oxygen Induction Type: IV induction Ventilation: Mask ventilation without difficulty Laryngoscope Size: McGraph and 3 Grade View: Grade I Tube type: Oral Tube size: 6.5 mm Number of attempts: 1 Airway Equipment and Method: Stylet and Oral airway Placement Confirmation: ETT inserted through vocal cords under direct vision, positive ETCO2 and breath sounds checked- equal and bilateral Secured at: 21 cm Tube secured with: Tape Dental Injury: Teeth and Oropharynx as per pre-operative assessment

## 2021-12-14 DIAGNOSIS — M48062 Spinal stenosis, lumbar region with neurogenic claudication: Secondary | ICD-10-CM | POA: Diagnosis not present

## 2021-12-14 MED ORDER — TIZANIDINE HCL 4 MG PO TABS: 4.0000 mg | ORAL_TABLET | Freq: Three times a day (TID) | ORAL | 0 refills | Status: DC | PRN

## 2021-12-14 MED ORDER — SENNA 8.6 MG PO TABS: 1.0000 | ORAL_TABLET | Freq: Every day | ORAL | 0 refills | Status: DC | PRN

## 2021-12-14 MED ORDER — OXYCODONE HCL 5 MG PO TABS: 5.0000 mg | ORAL_TABLET | ORAL | 0 refills | Status: DC | PRN

## 2021-12-14 MED ORDER — CELECOXIB 200 MG PO CAPS: 200.0000 mg | ORAL_CAPSULE | Freq: Two times a day (BID) | ORAL | 0 refills | Status: DC

## 2021-12-14 NOTE — Evaluation (Signed)
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 Her procedure was supposed to be outpatient however due to increased fluid in drain, MD decided to admit patient for observation for safety. Patient was up in chair at start of evaluation. She reports being pleased with minimal pain/numbness in BLE. She does have some discomfort in low back which is to be expected. Patient ambulated >200 feet, supervision with RW with normal base of support, reciprocal gait pattern and good foot clearance. She does ambulate at slightly slower gait speed (1.85 feet/sec). She was able to negotiate stairs, mod I with BUE rail assist. She does favor RLE due to history of knee pain. Patient has help from spouse at home. She does not demonstrate need for acute skilled PT intervention at this time. PT does recommend outpatient PT upon discharge to further address mobility and pain needs.      Recommendations for follow up therapy are one component of a multi-disciplinary discharge planning process, led by the attending physician.  Recommendations may be updated based on patient status, additional functional criteria and insurance authorization.  Follow Up Recommendations Outpatient PT    Assistance Recommended at Discharge Intermittent Supervision/Assistance  Patient can return home with the following  Assist for transportation;Assistance with cooking/housework;A little help with bathing/dressing/bathroom;A little help with walking and/or transfers    Equipment Recommendations Rolling walker (2 wheels)  Recommendations for Other Services       Functional Status Assessment  Patient has had a recent decline in their functional status and demonstrates the ability to make significant improvements in function in a reasonable and predictable amount of time.     Precautions / Restrictions Precautions Precautions: Back Restrictions Weight Bearing Restrictions: No      Mobility  Bed Mobility               General bed mobility comments: not observed; patient already in chair at start of PT evaluation; PT did re-educate patient in log roll technique    Transfers Overall transfer level: Modified independent Equipment used: Rolling walker (2 wheels)               General transfer comment: good hand placement and positioning;    Ambulation/Gait Ambulation/Gait assistance: Supervision Gait Distance (Feet): 225 Feet Assistive device: Rolling walker (2 wheels) Gait Pattern/deviations: Step-through pattern Gait velocity: 1.85 feet/sec on 10 feet walk test Gait velocity interpretation: 1.31 - 2.62 ft/sec, indicative of limited community ambulator   General Gait Details: exhibits normal base of support, good posture, good heel strike  Stairs Stairs: Yes Stairs assistance: Modified independent (Device/Increase time) Stair Management: Two rails, Step to pattern Number of Stairs: 4 General stair comments: exhibits good safety awareness and positioning;  Wheelchair Mobility    Modified Rankin (Stroke Patients Only)       Balance Overall balance assessment: Modified Independent                                           Pertinent Vitals/Pain Pain Assessment Pain Assessment: 0-10 Pain Score: 3  Pain Location: back Pain Descriptors / Indicators: Sore Pain  Intervention(s): Limited activity within patient's tolerance, Monitored during session, Repositioned    Home Living Family/patient expects to be discharged to:: Private residence Living Arrangements: Spouse/significant other Available Help at Discharge: Family;Available  24 hours/day Type of Home: House Home Access: Stairs to enter Entrance Stairs-Rails: Right;Left;Can reach both Entrance Stairs-Number of Steps: 8-10 (full flight) Alternate Level Stairs-Number of Steps: has chair lift Home Layout: Two level;Bed/bath upstairs Home Equipment: Shower seat;Shower seat - built in      Prior Function Prior Level of Function : Independent/Modified Independent;Driving;History of Falls (last six months)             Mobility Comments: 1 fall in the last 6 months ADLs Comments: MOD I-I with increased time     Hand Dominance   Dominant Hand: Right    Extremity/Trunk Assessment   Upper Extremity Assessment Upper Extremity Assessment: Overall WFL for tasks assessed    Lower Extremity Assessment Lower Extremity Assessment: RLE deficits/detail;LLE deficits/detail RLE Deficits / Details: ROM is WFL, strength: hip 3/5, knee 4/5, ankle 4/5 RLE Sensation: WNL RLE Coordination: WNL LLE Deficits / Details: ROM is WFL, strength: hip 4/5, knee 4/5, ankle 4/5 LLE Sensation: WNL LLE Coordination: WNL    Cervical / Trunk Assessment Cervical / Trunk Assessment: Back Surgery  Communication   Communication: No difficulties  Cognition Arousal/Alertness: Awake/alert Behavior During Therapy: WFL for tasks assessed/performed Overall Cognitive Status: Within Functional Limits for tasks assessed                                          General Comments General comments (skin integrity, edema, etc.): has drain in back, no swelling in LE    Exercises Other Exercises Other Exercises: Instructed patient in diaphragmatic breathing and core abdominal stabilization 5 sec hold x5 reps; Good positioning, denies any increase in pain;Recommend patient continue moving LE throughout day to maintain flexibility and strength; Pt verbalized understanding;   Assessment/Plan    PT Assessment All further PT needs can be met in the next venue of care  PT  Problem List Decreased strength;Decreased mobility;Pain       PT Treatment Interventions      PT Goals (Current goals can be found in the Care Plan section)  Acute Rehab PT Goals Patient Stated Goal: to go home PT Goal Formulation: With patient Time For Goal Achievement: 12/14/21 Potential to Achieve Goals: Good    Frequency       Co-evaluation               AM-PAC PT "6 Clicks" Mobility  Outcome Measure Help needed turning from your back to your side while in a flat bed without using bedrails?: A Little Help needed moving from lying on your back to sitting on the side of a flat bed without using bedrails?: A Little Help needed moving to and from a bed to a chair (including a wheelchair)?: A Little Help needed standing up from a chair using your arms (e.g., wheelchair or bedside chair)?: None Help needed to walk in hospital room?: None Help needed climbing 3-5 steps with a railing? : None 6 Click Score: 21    End of Session Equipment Utilized During Treatment: Gait belt Activity Tolerance: Patient tolerated treatment well;No increased pain Patient left: in chair;with call bell/phone within reach Nurse Communication: Mobility status PT Visit Diagnosis: Muscle weakness (generalized) (M62.81);Pain Pain - part of body:  (back)  Time: 0263-7858 PT Time Calculation (min) (ACUTE ONLY): 23 min   Charges:   PT Evaluation $PT Eval Low Complexity: 1 Low            Latise Dilley PT, DPT 12/14/2021, 12:42 PM

## 2021-12-14 NOTE — Evaluation (Signed)
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 Prior to hospital admission, pt was independent with mobility, ADL, and IADL. 1 fall in the last 12 months. Pt lives with spouse who is able to provide 24/7 assist/support as needed for pt. Pt is MOD I with all aspects of mobility and self care tasks at this time. Pt educated in spinal precautions, self care skills, AE, and home/routines modifications to maximize safety and functional independence while minimizing falls risk and maintaining precautions. Hand out provided. Pt verbalized understanding of all education/training provided. Able to return demonstration safe techniques while maintaining precautions throughout.  No additional skilled OT needs at this time. Will discharge in house. Upon hospital discharge, pt safe to discharge home.      Recommendations for follow up therapy are one component of a multi-disciplinary discharge planning process, led by the attending physician.  Recommendations may be updated based on patient status, additional functional criteria and insurance authorization.   Follow Up Recommendations  No OT follow up    Assistance Recommended at Discharge PRN  Patient can return home with the following A little help with walking and/or transfers    Functional Status Assessment  Patient has had a recent decline in their functional status and demonstrates the ability to make significant improvements in function in a reasonable and predictable amount of time.  Equipment Recommendations  None recommended by OT     Recommendations for Other Services       Precautions / Restrictions Precautions Precautions: Back Restrictions Weight Bearing Restrictions: No      Mobility Bed Mobility                    Transfers Overall transfer level: Modified independent Equipment used: Rolling walker (2 wheels)                      Balance Overall balance assessment: Modified Independent                                         ADL either performed or assessed with clinical judgement   ADL Overall ADL's : Needs assistance/impaired Eating/Feeding: Independent   Grooming: Wash/dry hands;Wash/dry face;Standing;Modified independent           Upper Body Dressing : Modified independent;Standing   Lower Body Dressing: Modified independent   Toilet Transfer: Modified Independent   Toileting- Clothing Manipulation and Hygiene: Modified independent       Functional mobility during ADLs: Modified independent;Rolling walker (2 wheels)       Vision Baseline Vision/History: 1 Wears glasses       Perception     Praxis      Pertinent Vitals/Pain Pain Assessment Pain Assessment: 0-10 Pain Score: 2  Pain Location: back Pain Descriptors / Indicators: Sore Pain Intervention(s): Limited activity within patient's tolerance, Repositioned, Monitored during session     Hand Dominance Right   Extremity/Trunk Assessment Upper Extremity Assessment Upper Extremity Assessment: Overall WFL for tasks assessed   Lower Extremity Assessment Lower Extremity  Assessment: Generalized weakness RLE Deficits / Details: ROM is WFL, strength: hip 3/5, knee 4/5, ankle 4/5 RLE Sensation: WNL RLE Coordination: WNL LLE Deficits / Details: ROM is WFL, strength: hip 4/5, knee 4/5, ankle 4/5 LLE Sensation: WNL LLE Coordination: WNL   Cervical / Trunk Assessment Cervical / Trunk Assessment: Back Surgery   Communication Communication Communication: No difficulties   Cognition  Arousal/Alertness: Awake/alert Behavior During Therapy: WFL for tasks assessed/performed Overall Cognitive Status: Within Functional Limits for tasks assessed                                 General Comments: alert and oriented x4     General Comments  drain    Exercises     Shoulder Instructions      Home Living Family/patient expects to be discharged to:: Private residence Living Arrangements: Spouse/significant other Available Help at Discharge: Family;Available 24 hours/day Type of Home: House Home Access: Stairs to enter [employer] of Steps: 8-10 (full flight) Entrance Stairs-Rails: Right;Left;Can reach both Home Layout: Two level;Bed/bath upstairs Alternate Level Stairs-Number of Steps: has chair lift   Bathroom Shower/Tub: Occupational psychologist: Handicapped height     Home Equipment: Shower seat;Shower seat - built in          Prior Functioning/Environment Prior Level of Function : Independent/Modified Independent;Driving;History of Falls (last six months)             Mobility Comments: 1 fall in the last 6 months ADLs Comments: MOD I-I with increased time        OT Problem List:        OT Treatment/Interventions:      OT Goals(Current goals can be found in the care plan section) Acute Rehab OT Goals Patient Stated Goal: to be active again OT Goal Formulation: With patient Time For Goal Achievement: 12/28/21 Potential to Achieve Goals: Good  OT Frequency:      Co-evaluation              AM-PAC OT "6 Clicks" Daily Activity     Outcome Measure Help from another person eating meals?: None Help from another person taking care of personal grooming?: None Help from another person toileting, which includes using toliet, bedpan, or urinal?: None Help from another person bathing (including washing, rinsing, drying)?: None Help from another person to put on and taking off regular upper body clothing?:  None Help from another person to put on and taking off regular lower body clothing?: None 6 Click Score: 24   End of Session Equipment Utilized During Treatment: Gait belt;Rolling walker (2 wheels)  Activity Tolerance: Patient tolerated treatment well Patient left: in chair;with call bell/phone within reach  OT Visit Diagnosis: Unsteadiness on feet (R26.81);History of falling (Z91.81)                Time: 8657-8469 OT Time Calculation (min): 12 min Charges:  OT General Charges $OT Visit: 1 Visit OT Evaluation $OT Eval Low Complexity: 1 Low Shanon Payor, OTD OTR/L  12/14/21, 12:46 PM

## 2021-12-14 NOTE — Clinical Social Work Note (Signed)
Occupational Therapy * Physical Therapy * Speech Therapy          DATE ___2/4/23________________ PATIENT NAME____[name] _________________ PATIENT MRN___[id number]_________________  DIAGNOSIS/DIAGNOSIS CODE _____Lumbar Radiculopathy/ M54.16_________________ DATE OF DISCHARGE: ___2/4/23___________  PRIMARY CARE PHYSICIAN _____Dr Mychal Powell_____________________ PCP PHONE/FAX________[phone]___________________     Dear Provider (Name: __________________   Fax: ___________________________):   I certify that I have examined this patient and that occupational/physical/speech therapy is necessary on an outpatient basis.    The patient has expressed interest in completing their recommended course of therapy at your location.  Once a formal order from the patient's primary care physician has been obtained, please contact him/her to schedule an appointment for evaluation at your earliest convenience.   [  X]  Physical Therapy Evaluate and Treat          [  ]  Occupational Therapy Evaluate and Treat                                    [  ]  Speech Therapy Evaluate and Treat       The patient's primary care physician (listed above) must furnish and be responsible for a formal order such that the recommended services may be furnished while under the primary physician's care, and that the plan of care will be established and reviewed every 30 days (or more often if condition necessitates).

## 2021-12-14 NOTE — Progress Notes (Signed)
° °   Attending Progress Note  History: Julie Chang is here for lumbar stenosis.  POD1: She is doing well without significant pain.  Physical Exam: Vitals:   12/13/21 2029 12/14/21 0403  BP: (!) 122/57 125/70  Pulse: 89 77  Resp: 17 17  Temp: 98.7 F (37.1 C) 97.7 F (36.5 C)  SpO2: 96% 98%    AA Ox3 CNI  Strength:5/5 throughout BLE  Drain 130 in past 24 hrs  Data:  No results for input(s): NA, K, CL, CO2, BUN, CREATININE, LABGLOM, GLUCOSE, CALCIUM in the last 168 hours. No results for input(s): AST, ALT, ALKPHOS in the last 168 hours.  Invalid input(s): TBILI   No results for input(s): WBC, HGB, HCT, PLT in the last 168 hours. No results for input(s): APTT, INR in the last 168 hours.       Other tests/results: n/a  Assessment/Plan:  Julie Chang is doing well s/p lumbar decompression  - mobilize - pain control - DVT prophylaxis - PTOT today - will continue to monitor drain - too much output for removal at this time   Meade Maw MD, [facility] Department of Neurosurgery

## 2021-12-14 NOTE — Discharge Summary (Signed)
Physician Discharge Summary  Patient ID: Julie Chang MRN: [id number] DOB/AGE: [birth year] 76 y.o.  Admit date: 12/13/2021 Discharge date: 12/14/2021  Admission Diagnoses: neurogenic claudication  Discharge Diagnoses:  Principal Problem:   Lumbar radiculopathy   Discharged Condition: good  Hospital Course: Mrs. Julie Chang presented for surgical intervention.  She did very well with surgery and was observed overnight.  After her drain was removed, she was felt to be stable for discharge.  Consults: None  Significant Diagnostic Studies: radiology: X-Ray: Appropriate level localized in surgery  Treatments: surgery: L3-5 decompression  Discharge Exam: Blood pressure (!) 129/93, pulse 85, temperature (!) 97.5 F (36.4 C), resp. rate 15, height 5\' 2"  (1.575 m), weight 99.3 kg, SpO2 97 %. General appearance: alert and cooperative CNI MAEW 5/5  Disposition: Discharge disposition: 01-Home or Self Care       Discharge Instructions     Incentive spirometry RT   Complete by: As directed    Remove dressing in 48 hours   Complete by: As directed       Allergies as of 12/14/2021       Reactions   Dilaudid [hydromorphone] Nausea And Vomiting   Latex Itching   Statins Other (See Comments)   Joint pain        Medication List     STOP taking these medications    aspirin 81 MG tablet       TAKE these medications    Acetyl L-Carnitine 500 MG Caps Take 500 mg by mouth daily.   b complex vitamins capsule Take 1 capsule by mouth daily.   celecoxib 200 MG capsule Commonly known as: CELEBREX Take 1 capsule (200 mg total) by mouth every 12 (twelve) hours.   Coenzyme Q10 100 MG capsule Take 100 mg by mouth daily.   famotidine 10 MG tablet Commonly known as: PEPCID Take 10 mg by mouth at bedtime as needed for heartburn or indigestion.   Flaxseed Oil 1000 MG Caps Take by mouth.   glimepiride 4 MG tablet Commonly known as: AMARYL Take 4 mg by mouth daily as needed  (>200).   glucosamine-chondroitin 500-400 MG tablet Take 1 tablet by mouth in the morning and at bedtime.   lisinopril 10 MG tablet Commonly known as: ZESTRIL Take 10 mg by mouth daily.   metFORMIN 500 MG 24 hr tablet Commonly known as: GLUCOPHAGE-XR Take 1,000 mg by mouth in the morning and at bedtime.   multivitamin with minerals tablet Take 1 tablet by mouth daily.   oxyCODONE 5 MG immediate release tablet Commonly known as: Oxy IR/ROXICODONE Take 1 tablet (5 mg total) by mouth every 3 (three) hours as needed for moderate pain ((score 4 to 6)).   RED YEAST RICE PO Take 1 capsule by mouth daily.   senna 8.6 MG Tabs tablet Commonly known as: SENOKOT Take 1 tablet (8.6 mg total) by mouth daily as needed for mild constipation.   tiZANidine 4 MG tablet Commonly known as: ZANAFLEX Take 1 tablet (4 mg total) by mouth every 8 (eight) hours as needed for muscle spasms.   Trulicity 3 QA/8.3MH Sopn Generic drug: Dulaglutide Inject 3 mg into the skin every Sunday.   TURMERIC-GINGER PO Take 1 capsule by mouth daily.   Vitamin D 50 MCG (2000 UT) tablet Take 2,000 Units by mouth daily.               Durable Medical Equipment  (From admission, onward)           Start  Ordered   12/14/21 1352  For home use only DME 4 wheeled rolling walker with seat  Once       Question:  Patient needs a walker to treat with the following condition  Answer:  Lumbar stenosis with neurogenic claudication   12/14/21 1351            Follow-up Information     Loleta Dicker, PA Follow up in 2 week(s).   Why: For wound check and post-op f/u. This appointment should already be scheduled with ther [facility]. Please call the office with any questions or concerns Contact information: [place] [address] [phone]                 Signed: Meade Maw 12/14/2021, 4:14 PM

## 2021-12-14 NOTE — Plan of Care (Signed)
Patient sleeping between care. Aox4. Pain controlled. Neuro assessment WNL. Plan of care reviewed with patient. Call bell within reach.  PLAN OF CARE ONGOING Problem: Clinical Measurements: Goal: Will remain free from infection Outcome: Progressing   Problem: Education: Goal: Knowledge of General Education information will improve Description: Including pain rating scale, medication(s)/side effects and non-pharmacologic comfort measures Outcome: Progressing   Problem: Health Behavior/Discharge Planning: Goal: Ability to manage health-related needs will improve Outcome: Progressing   Problem: Clinical Measurements: Goal: Ability to maintain clinical measurements within normal limits will improve Outcome: Progressing Goal: Will remain free from infection Outcome: Progressing Goal: Diagnostic test results will improve Outcome: Progressing Goal: Respiratory complications will improve Outcome: Progressing Goal: Cardiovascular complication will be avoided Outcome: Progressing   Problem: Activity: Goal: Risk for activity intolerance will decrease Outcome: Progressing   Problem: Nutrition: Goal: Adequate nutrition will be maintained Outcome: Progressing   Problem: Coping: Goal: Level of anxiety will decrease Outcome: Progressing   Problem: Elimination: Goal: Will not experience complications related to bowel motility Outcome: Progressing Goal: Will not experience complications related to urinary retention Outcome: Progressing   Problem: Pain Managment: Goal: General experience of comfort will improve Outcome: Progressing   Problem: Safety: Goal: Ability to remain free from injury will improve Outcome: Progressing   Problem: Skin Integrity: Goal: Risk for impaired skin integrity will decrease Outcome: Progressing

## 2021-12-24 ENCOUNTER — Ambulatory Visit: Payer: Medicare Other | Attending: Physician Assistant

## 2021-12-24 ENCOUNTER — Other Ambulatory Visit: Payer: Self-pay

## 2021-12-24 DIAGNOSIS — M6281 Muscle weakness (generalized): Secondary | ICD-10-CM | POA: Diagnosis present

## 2021-12-24 DIAGNOSIS — R262 Difficulty in walking, not elsewhere classified: Secondary | ICD-10-CM | POA: Insufficient documentation

## 2021-12-24 DIAGNOSIS — M5416 Radiculopathy, lumbar region: Secondary | ICD-10-CM | POA: Insufficient documentation

## 2021-12-24 NOTE — Patient Instructions (Signed)
Access Code: [email or web address] URL: [email or web address] Date: 12/24/2021 Prepared by: Dorcas Carrow  Exercises Sit to Stand - 2 x daily - 7 x weekly - 2 sets - 8 reps Hooklying Gluteal Sets - 2 x daily - 7 x weekly - 20 reps - 5 hold Supine Diaphragmatic Breathing - 2 x daily - 7 x weekly - 10 reps

## 2021-12-25 NOTE — Therapy (Signed)
[place] [facility] [address] [place], [place], [address] Phone: [phone]   Fax:  [phone]  Physical Therapy Evaluation  Patient Details  Name: Julie Chang MRN: [id number] Date of Birth: September 08, 1946 No data recorded  Encounter Date: 12/24/2021   PT End of Session - 12/25/21 0808     Visit Number 1    Number of Visits 12    Date for PT Re-Evaluation 02/04/22    Authorization - Visit Number 1    Authorization - Number of Visits 12    PT Start Time 1430    PT Stop Time 5400    PT Time Calculation (min) 45 min    Activity Tolerance Patient tolerated treatment well    Behavior During Therapy WFL for tasks assessed/performed             Past Medical History:  Diagnosis Date   Anemia    Arthritis    Cancer ([place])    basal cell   Chronic kidney disease    Diabetes mellitus without complication ([place])    GERD (gastroesophageal reflux disease)    Hypercholesteremia    Hypertension    Neuromuscular disorder ([place])    spinal stenosis    Past Surgical History:  Procedure Laterality Date   APPENDECTOMY     COLONOSCOPY     ESOPHAGOGASTRODUODENOSCOPY     KNEE ARTHROPLASTY     right   LUMBAR LAMINECTOMY/DECOMPRESSION MICRODISCECTOMY N/A 12/13/2021   Procedure: L3-5 POSTERIOR SPINAL DECOMPRESSION;  Surgeon: Meade Maw, MD;  Location: [facility] ORS;  Service: Neurosurgery;  Laterality: N/A;   SHOULDER ARTHROSCOPY WITH ROTATOR CUFF REPAIR     right   TOTAL KNEE ARTHROPLASTY      There were no vitals filed for this visit.    Subjective Assessment - 12/24/21 1800     Subjective Pt reports undergoing lumbar decompression surgery (L3-5) on 12/13/21. She states she developed low back pain with R LE radicular sx's in June 2022.  Sx's involved numbness/tingling/weakness/pain and urge urinary incontinence and occasional fecal incontinence.  She reports the surgery was helpful as she is no longer experiencing any of her R LE or  low back sx's.  Her back is a little achy from undergoing the surgery along the incision.  She is still experiencing the urinary incontinence and occasional fecal incontinence.  She reports being on post-op precautions (no bending, lifting, twisting, driving) x 2 weeks and then "be careful" x 4 weeks.    Limitations Walking;House hold activities;Standing;Lifting    How long can you stand comfortably? limited to short duration (5-10 min)    How long can you walk comfortably? limited to short duration (5-10 min)    Patient Stated Goals to be able to stand and walk longer, especially to begin a walking program; also would like the incontinence sx to be better    Currently in Pain? Yes    Pain Score 1     Pain Orientation Posterior    Pain Descriptors / Indicators Aching    Pain Type Acute pain             OBJECTIVE  OBSERVATION: Pt's incision site is healing well along central lumbar spine- no drainage noted  Gait Pt amb with slight decreased gait speed, decreased stride length; not using any AD   Neuro Screen: In tact sensation to lt touch bilateral L2-S2 dermatomes  Patellar/Achilles reflexes diminished bilaterally    Strength (out of 5) R/L 4/4 Hip flexion 4/4  Hip ER 3+/3+ Hip abduction 5/5 Hip adduction 3+/3+ Hip extension 4/4+ Knee extension 5/5 Knee flexion 5/5 Ankle dorsiflexion Ankle plantarflexion- able to perform 3 unilateral heel raises R and L Abdominal brace/TA activation: pt demonstrates breath holding and abdominal bulging/valsalva when cued for TA activation     AROM (degrees)- did not assess lumbar spine AROM per post-op protocol R/L (all movements include overpressure unless otherwise stated)  Hip IR (0-45): R:15 L: 15 Hip ER (0-45): R:30 L: 35 Hip Flexion (0-125): 100 R, 105 L *Indicates pain   Functional Tasks Lifting: deferred  Squatting: deferred  Sit to stand: pt able to perform; requires use of UE support from mat  table                PT Education - 12/24/21 1541     Education Details Access Code: [email or web address]; reviewed post op precautions    Person(s) Educated Patient    Methods Handout              PT Short Term Goals - 12/24/21 0821       PT SHORT TERM GOAL #1   Title Pt will demonstrate ability to perform sit to supine and sit to stand transfers with neutral spine body mechanics    Baseline PT cues for sit to supine technique today    Time 2    Period Weeks    Status New    Target Date 01/07/22               PT Long Term Goals - 12/24/21 0818       PT LONG TERM GOAL #1   Title Pt will demonstrate ability to stand and walk x 30 min to perform her daily house/yard work activities independently    Baseline 5-10 min    Time 6    Period Weeks    Status New    Target Date 02/04/22      PT LONG TERM GOAL #2   Title Improve FOTO to 9 indicating ability to perform her normal daily activities without being limited by lumbar spine sx    Baseline 53    Time 6    Period Weeks    Status New    Target Date 02/04/22      PT LONG TERM GOAL #3   Title Improve glute max strength to >4/5 to faciltate ability to begin a walking program for exercise    Baseline 3+    Time 6    Period Weeks    Target Date 01/07/22                    Plan - 12/25/21 0809     Clinical Impression Statement Pt is a 76 y/o F who presents to PT after undergoing L3-5 lumbar decompression surgery.  She is an appropriate candidate for skilled PT to address her decreased lumbar spine and lower extremity ROM/strength/endurance and her decreased walking/standing/activity tolerance.  She would also benefit from a pelvic floor PT consult to further evaluate her complaint of urinary and fecal incontinence.  She is motivated to participate in tx and should do well with a course of PT.    Personal Factors and Comorbidities Age;Comorbidity 3+    Examination-Activity Limitations Bed  Mobility;Bend;Carry;Continence;Lift;Stand;Toileting;Squat;Sit    Examination-Participation Restrictions Community Activity;Driving;Laundry;Yard Work;Cleaning    Stability/Clinical Decision Making Evolving/Moderate complexity    Clinical Decision Making Moderate    Rehab Potential Excellent    PT Frequency 2x / week  PT Duration 6 weeks    PT Treatment/Interventions ADLs/Self Care Home Management;Cryotherapy;Moist Heat;Neuromuscular re-education;Therapeutic exercise;Therapeutic activities;Functional mobility training;Gait training;Patient/family education;Manual techniques    PT Home Exercise Plan diaphragmatic breathing, glute set, sit to stand; walking 5-10 mins daily; continue with post-op precautions    Recommended Other Services consult with pelvic health PT    Consulted and Agree with Plan of Care Patient             Patient will benefit from skilled therapeutic intervention in order to improve the following deficits and impairments:  Decreased range of motion, Decreased endurance, Pain, Decreased activity tolerance, Decreased strength, Decreased mobility, Difficulty walking, Hypomobility  Visit Diagnosis: Lumbar radiculopathy  Difficulty in walking, not elsewhere classified  Muscle weakness (generalized)     Problem List Patient Active Problem List   Diagnosis Date Noted   Lumbar radiculopathy 12/13/2021    Pincus Badder, PT 12/25/2021, 8:33 AM Merdis Delay, PT, DPT  [phone]  [facility] [address] [place], [place], [address] Phone: [phone]   Fax:  [phone]  Name: Julie Chang MRN: [id number] Date of Birth: 1946/09/11

## 2021-12-26 ENCOUNTER — Ambulatory Visit: Payer: Medicare Other | Admitting: Physical Therapy

## 2021-12-31 ENCOUNTER — Ambulatory Visit: Payer: Medicare Other | Admitting: Physical Therapy

## 2022-01-02 ENCOUNTER — Ambulatory Visit: Payer: Medicare Other | Admitting: Physical Therapy

## 2022-01-07 ENCOUNTER — Encounter: Payer: Commercial Managed Care - PPO | Admitting: Physical Therapy

## 2022-01-09 ENCOUNTER — Encounter: Payer: Commercial Managed Care - PPO | Admitting: Physical Therapy

## 2022-01-14 ENCOUNTER — Encounter: Payer: Commercial Managed Care - PPO | Admitting: Physical Therapy

## 2022-01-16 ENCOUNTER — Encounter: Payer: Commercial Managed Care - PPO | Admitting: Physical Therapy

## 2022-01-20 ENCOUNTER — Ambulatory Visit: Payer: Medicare Other | Attending: Neurosurgery | Admitting: Physical Therapy

## 2022-01-20 ENCOUNTER — Other Ambulatory Visit: Payer: Self-pay

## 2022-01-20 ENCOUNTER — Encounter: Payer: Self-pay | Admitting: Physical Therapy

## 2022-01-20 DIAGNOSIS — R278 Other lack of coordination: Secondary | ICD-10-CM | POA: Insufficient documentation

## 2022-01-20 DIAGNOSIS — M6281 Muscle weakness (generalized): Secondary | ICD-10-CM | POA: Diagnosis present

## 2022-01-20 DIAGNOSIS — M25552 Pain in left hip: Secondary | ICD-10-CM | POA: Insufficient documentation

## 2022-01-20 NOTE — Therapy (Signed)
[place] [facility] [address] [place], [place], [address] Phone: [phone]   Fax:  [phone]  Physical Therapy Evaluation  Patient Details  Name: Julie Chang MRN: [id number] Date of Birth: Nov 03, 1946 Referring Provider (PT): Meade Maw   Encounter Date: 01/20/2022   PT End of Session - 01/20/22 1124     Visit Number 1    Number of Visits 12    Date for PT Re-Evaluation 04/14/22    Authorization Type IE 01/20/2022    PT Start Time 1120    PT Stop Time 1200    PT Time Calculation (min) 40 min    Activity Tolerance Patient tolerated treatment well    Behavior During Therapy [facility] for tasks assessed/performed             Past Medical History:  Diagnosis Date   Anemia    Arthritis    Cancer ([place])    basal cell   Chronic kidney disease    Diabetes mellitus without complication ([place])    GERD (gastroesophageal reflux disease)    Hypercholesteremia    Hypertension    Neuromuscular disorder ([place])    spinal stenosis    Past Surgical History:  Procedure Laterality Date   APPENDECTOMY     COLONOSCOPY     ESOPHAGOGASTRODUODENOSCOPY     KNEE ARTHROPLASTY     right   LUMBAR LAMINECTOMY/DECOMPRESSION MICRODISCECTOMY N/A 12/13/2021   Procedure: L3-5 POSTERIOR SPINAL DECOMPRESSION;  Surgeon: Meade Maw, MD;  Location: [facility] ORS;  Service: Neurosurgery;  Laterality: N/A;   SHOULDER ARTHROSCOPY WITH ROTATOR CUFF REPAIR     right   TOTAL KNEE ARTHROPLASTY      There were no vitals filed for this visit.        [facility] PT Assessment - 01/20/22 1121       Assessment   Medical Diagnosis Urinary frequency and urgency    Referring Provider (PT) Meade Maw    Hand Dominance Right    Next MD Visit March 2023      Balance Screen   Has the patient fallen in the past 6 months No            PELVIC HEALTH PHYSICAL THERAPY EVALUATION  SCREENING Red Flags: None Have you had any night  sweats? Unexplained weight loss? Saddle anesthesia? Unexplained changes in bowel or bladder habits?  SUBJECTIVE  Chief Complaint: Patient reports that she has to drink a lot of fluids because of medications she is on. Patient endorses frequent urination as a result, and has urge urinary incontinence. Patient notes this is worse at night. Patient states that during daytime, she uses bladder training and has fewer issues. Patient also notes bowel issues have been improving without any clear explanation. Patient was having frequent BMs. Patient notes that she does have FI about 1x/month; the loss of stool is near complete but not. Patient states that this has been going on for a couple years and has not improved since surgery.   Pertinent History:  Falls Negative.  Scoliosis Negative. Pulmonary disease/dysfunction Negative. Surgical history: Positive for see above.   Obstetrical History: G1P1 Deliveries: SVD Tearing/Episiotomy: episiotomy  Gynecological History: Hysterectomy: No  Endometriosis: Negative Pelvic Organ Prolapse: Positive for "a little bit" Last Menstrual Period:  Pain with exam: No Heaviness/pressure: No    Urinary History: Incontinence: Positive. Onset: 2021 Triggers: sleeping and waking with urge (60%); prolonged sitting with urge and transfer (35%), coughing/sneezing (5%).  Amount: Most everything.  Protective undergarments:  Yes  Type: 4-5  Number used/day: 3/night, 1-2/day Fluid Intake: 2-3x 16.9 oz H20, coffee/tea caffeinated. Nocturia: 1-2x/night Frequency of urination: every 2 hours Pain with urination: Negative Difficulty initiating urination: Negative Intermittent stream: Negative Frequent UTI: Negative.   Gastrointestinal History: [place] Stool Chart: Type 4-5 Frequency of BMs: 2-3x/day Pain with defecation: Negative Straining with defecation: Negative Hemorrhoids: Positive ; internal and external; latent Toileting posture: feet flat Incontinence:  Positive. Onset: 2021 Triggers: urge. Amount: Min- almost complete loss (minimal leakage: rare). Patient endorses leakage of solid and gas.  Typical food intake: Breakfast: english muffin with cheese; Lunch: fast food/dining out; Dinner: soup, pizza, green vegetable; Snacks: occasional cookies.  Sexual activity/pain: Not presently active.  Location of pain: L hip Current pain:  3/10  Max pain:  8/10; no known aggravating factors Least pain:  3/10 Pain quality: pain quality: strong aching and occasional stabbing Radiating pain: No    OBJECTIVE  Mental Status Patient is oriented to person, place and time.  Recent memory is intact.  Remote memory is intact.  Attention span and concentration are intact.  Expressive speech is intact.  Patient's fund of knowledge is within normal limits for educational level.  POSTURE/OBSERVATIONS:  Lumbar lordosis: WFL Thoracic kyphosis: mildly increased Iliac crest height: L appearing elevated, not formally assessed Lumbar lateral shift: not formally assessed Pelvic obliquity: not formally assessed Leg length discrepancy: not formally assessed  GAIT: WBOS, grossly WFL. B Trendelenburg, L>R.   RANGE OF MOTION: deferred 2/2 to time constraints   LEFT RIGHT  Lumbar forward flexion (65):      Lumbar extension (30):     Lumbar lateral flexion (25):     Thoracic and Lumbar rotation (30 degrees):       Hip Flexion (0-125):      Hip IR (0-45):     Hip ER (0-45):     Hip Abduction (0-40):     Hip extension (0-15):       SENSATION: deferred 2/2 to time constraints  STRENGTH: MMT deferred 2/2 to time constraints  RLE LLE  Hip Flexion    Hip Extension    Hip Abduction     Hip Adduction     Hip ER     Hip IR     Knee Extension    Knee Flexion    Dorsiflexion     Plantarflexion (seated)     ABDOMINAL: deferred 2/2 to time constraints Palpation: Diastasis: Scar mobility: Rib flare:  SPECIAL TESTS: deferred 2/2 to time  constraints  PHYSICAL PERFORMANCE MEASURES: STS: WNL  EXTERNAL PELVIC EXAM: deferred 2/2 to time constraints Palpation: Breath coordination: Voluntary Contraction: present/absent Relaxation: full/delayed/non-relaxing Perineal movement with sustained IAP increase ("bear down"): descent/no change/elevation/excessive descent Perineal movement with rapid IAP increase ("cough"): elevation/no change/descent  INTERNAL VAGINAL EXAM: deferred 2/2 to time constraints Introitus Appears:  Skin integrity:  Scar mobility: Strength (PERF):  Symmetry: Palpation: Prolapse:   INTERNAL RECTAL EXAM: not indicated Strength (PERF): Symmetry: Palpation: Prolapse:   OUTCOME MEASURES: FOTO (Urinary 51, Bowel 53)   ASSESSMENT Patient is a 76 year old presenting to clinic with chief complaints of accidental urinary and bowel leakage. Today's evaluation is suggestive of deficits in PFM strength, PFM coordination, bowel and bladder habits, L hip pain as evidenced by 8/10 worst L hip pain, use of 5 incontinence pads/24 hours period, bowel leakage with urgency, urinary leakage with urgency and stress components. Patient's responses on FOTO outcome measures (Urinary 51, Bowel 53) indicate significant functional limitations/disability/distress. Patient's progress may  be limited due to persistence of complaints; however, patient's motivation is advantageous. Patient will benefit from continued skilled therapeutic intervention to address deficits in PFM strength, PFM coordination, bowel and bladder habits, L hip pain in order to increase function and improve overall QOL.  EDUCATION Patient educated on prognosis, POC, and provided with HEP including: not initiated. Patient articulated understanding and returned demonstration. Patient will benefit from further education in order to maximize compliance and understanding for long-term therapeutic gains.     Objective measurements completed on examination: See  above findings.      PT Long Term Goals - 01/20/22 1430       PT LONG TERM GOAL #1   Title Patient will report decreased reliance on protective undergarments as indicated by < 3/24 hour period to demonstrate improved bladder control and allow for increased participation in activities outside of the home.    Baseline IE: 3/night, 2/day    Time 12    Period Weeks    Status New    Target Date 04/14/22      PT LONG TERM GOAL #2   Title Patient will demonstrate improved function as evidenced by a score of 58 on FOTO Urinary measure for full participation in activities at home and in the community.    Baseline IE: 51    Time 12    Period Weeks    Status New    Target Date 04/14/22      PT LONG TERM GOAL #3   Title Patient will demonstrate improved function as evidenced by a score of 62 on FOTO Bowel measure for full participation in activities at home and in the community.    Baseline IE: 75    Time 12    Period Weeks    Status New    Target Date 04/14/22      PT LONG TERM GOAL #4   Title Patient will demonstrate circumferential and sequential contraction of >4/5 MMT, > 6 sec hold x10 and 5 consecutive quick flicks with </= 10 min rest between testing bouts, and relaxation of the PFM coordinated with breath for improved management of intra-abdominal pressure and normal bowel and bladder function without the presence of pain nor incontinence in order to improve participation at home and in the community.    Baseline IE: not assessed    Time 12    Period Weeks    Status New    Target Date 04/14/22                    Plan - 01/20/22 1125     Clinical Impression Statement Patient is a 76 year old presenting to clinic with chief complaints of accidental urinary and bowel leakage. Today's evaluation is suggestive of deficits in PFM strength, PFM coordination, bowel and bladder habits, L hip pain as evidenced by 8/10 worst L hip pain, use of 5 incontinence pads/24 hours  period, bowel leakage with urgency, urinary leakage with urgency and stress components. Patient's responses on FOTO outcome measures (Urinary 51, Bowel 53) indicate significant functional limitations/disability/distress. Patient's progress may be limited due to persistence of complaints; however, patient's motivation is advantageous. Patient will benefit from continued skilled therapeutic intervention to address deficits in PFM strength, PFM coordination, bowel and bladder habits, L hip pain in order to increase function and improve overall QOL.    Personal Factors and Comorbidities Age;Comorbidity 3+;Behavior Pattern;Time since onset of injury/illness/exacerbation    Comorbidities anemia, CKD, DM, GERD, HTN, hypercholesteremia  Examination-Activity Limitations Bed Mobility;Bend;Carry;Continence;Lift;Stand;Toileting;Squat;Sit    Examination-Participation Restrictions Community Activity;Driving;Laundry;Yard Work;Cleaning    Stability/Clinical Decision Making Evolving/Moderate complexity    Clinical Decision Making Moderate    Rehab Potential Fair    PT Frequency 1x / week    PT Duration 12 weeks    PT Treatment/Interventions ADLs/Self Care Home Management;Cryotherapy;Moist Heat;Neuromuscular re-education;Therapeutic exercise;Therapeutic activities;Patient/family education;Manual techniques;Electrical Stimulation;Orthotic Fit/Training;Scar mobilization;Taping    PT Next Visit Plan physical assessment    PT Home Exercise Plan not initiatied    Consulted and Agree with Plan of Care Patient             Patient will benefit from skilled therapeutic intervention in order to improve the following deficits and impairments:  Decreased endurance, Decreased activity tolerance, Decreased strength, Decreased coordination, Improper body mechanics, Pain  Visit Diagnosis: Muscle weakness (generalized)  Other lack of coordination  Pain in left hip     Problem List Patient Active Problem List    Diagnosis Date Noted   Lumbar radiculopathy 12/13/2021    Myles Gip PT, DPT [phone]  01/20/2022, 2:40 PM  [facility] [address] [place], [place], [address] Phone: [phone]   Fax:  [phone]  Name: Julie Chang MRN: [id number] Date of Birth: 1945-11-24

## 2022-01-21 ENCOUNTER — Encounter: Payer: Commercial Managed Care - PPO | Admitting: Physical Therapy

## 2022-01-23 ENCOUNTER — Encounter: Payer: Commercial Managed Care - PPO | Admitting: Physical Therapy

## 2022-01-27 ENCOUNTER — Ambulatory Visit: Payer: Medicare Other | Admitting: Physical Therapy

## 2022-01-28 ENCOUNTER — Encounter: Payer: Commercial Managed Care - PPO | Admitting: Physical Therapy

## 2022-01-30 ENCOUNTER — Encounter: Payer: Commercial Managed Care - PPO | Admitting: Physical Therapy

## 2022-02-03 ENCOUNTER — Ambulatory Visit: Payer: Medicare Other | Admitting: Physical Therapy

## 2022-02-03 ENCOUNTER — Other Ambulatory Visit: Payer: Self-pay

## 2022-02-03 DIAGNOSIS — M6281 Muscle weakness (generalized): Secondary | ICD-10-CM

## 2022-02-03 DIAGNOSIS — M25552 Pain in left hip: Secondary | ICD-10-CM

## 2022-02-03 DIAGNOSIS — R278 Other lack of coordination: Secondary | ICD-10-CM

## 2022-02-03 NOTE — Therapy (Signed)
[place] ?[facility] ?[address]. [place], [place], [address] ?Phone: [phone]   Fax:  [phone] ? ?Physical Therapy Treatment ? ?Patient Details  ?Name: Julie Chang ?MRN: [id number] ?Date of Birth: 05-12-1946 ?Referring Provider (PT): Meade Maw ? ? ?Encounter Date: 02/03/2022 ? ? PT End of Session - 02/03/22 1112   ? ? Visit Number 2   ? Number of Visits 12   ? Date for PT Re-Evaluation 04/14/22   ? Authorization Type IE 01/20/2022   ? PT Start Time 1112   ? PT Stop Time 1155   ? PT Time Calculation (min) 43 min   ? Activity Tolerance Patient tolerated treatment well   ? Behavior During Therapy [facility] for tasks assessed/performed   ? ?  ?  ? ?  ? ? ?Past Medical History:  ?Diagnosis Date  ? Anemia   ? Arthritis   ? 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 Chronic kidney disease   ? Diabetes mellitus without complication ([place])   ? GERD (gastroesophageal reflux disease)   ? Hypercholesteremia   ? Hypertension   ? Neuromuscular disorder ([place])   ? spinal stenosis  ? ? ?Past Surgical History:  ?Procedure Laterality Date  ? APPENDECTOMY    ? COLONOSCOPY    ? ESOPHAGOGASTRODUODENOSCOPY    ? KNEE ARTHROPLASTY    ? right  ? LUMBAR LAMINECTOMY/DECOMPRESSION MICRODISCECTOMY N/A 12/13/2021  ? Procedure: L3-5 POSTERIOR SPINAL DECOMPRESSION;  Surgeon: Meade Maw, MD;  Location: [facility] ORS;  Service: Neurosurgery;  Laterality: N/A;  ? SHOULDER ARTHROSCOPY WITH ROTATOR CUFF REPAIR    ? right  ? TOTAL KNEE ARTHROPLASTY    ? ? ?There were no vitals filed for this visit. ? ? Subjective Assessment - 02/03/22 1113   ? ? Subjective Patient notes that she had a few accidents while out of town. Patient notes she will be seeing orthopedics for R posterior inferior hip/pelvic pain (worst 8-9/10); made worse by bending and walking.   ? Currently in Pain? Yes   ? Pain Score 5    ? Pain Location Buttocks   ? Pain Orientation Right;Distal   ? ?  ?  ? ?  ? ? ? ? ? ?TREATMENT ? ?Pre-treatment  assessment: ?RANGE OF MOTION:  ?  LEFT RIGHT  ?Hip Flexion (0-125):      ?Hip IR (0-45):     ?Hip ER (0-45):     ?Hip Abduction (0-40):     ?Hip extension (0-15):     ? ?SENSATION: ?Grossly intact to light touch bilateral LEs as determined by testing dermatomes L2-S2 ?Proprioception and hot/cold testing deferred on this date ? ?STRENGTH: MMT  ? RLE LLE  ?Hip Flexion 5 5  ?Hip Extension    ?Hip Abduction  5 5  ?Hip Adduction  4 4  ?Hip ER  5 5  ?Hip IR  5 5  ?Knee Extension 5 5  ?Knee Flexion 5 5  ?Dorsiflexion  5 5  ?Plantarflexion (seated) 5 5  ? ?ABDOMINAL:  ?Palpation: no TTP ?Diastasis: 4-5 finger throughout ? ?SPECIAL TESTS: ?SLR (SN 92, -LR 0.29): R: Negative L:  Negative ?FABER (SN 81): R: Negative L: Negative ?FADIR (SN 94): R: Negative L: Negative ? ?EXTERNAL PELVIC EXAM: Patient educated on the purpose of the pelvic exam and articulated understanding; patient consented to the exam verbally. ?Breath coordination: minimally and inconsistently present ?Voluntary Contraction: present; palpable squeeze and lift ?Relaxation: full ?Perineal movement with sustained IAP increase ("bear  down"): no significant change; excessive rectus dominance ?Perineal movement with rapid IAP increase ("cough"): no change ? ?Manual Therapy: ? ? ?Neuromuscular Re-education: ?Supine hooklying diaphragmatic breathing with VCs and TCs for downregulation of the nervous system and improved management of IAP ?Supine hooklying, TrA activation with exhalation. VCs and TCs to decrease compensatory patterns and minimize aggravation of the lumbar paraspinals. ?Supine hooklying DRAM correction with gentle compression and coordinated breath for improved IAP management. ? ? ?Therapeutic Exercise: ? ? ?Treatments unbilled: ? ?Post-treatment assessment: ? ?Patient educated throughout session on appropriate technique and form using multi-modal cueing, HEP, and activity modification. Patient articulated understanding and returned  demonstration. ? ?Patient Response to interventions: ?Comfortable to return in 1 week for abdominal progressions ? ?ASSESSMENT ?Patient presents to clinic with excellent motivation to participate in therapy. Patient demonstrates deficits in PFM strength, PFM coordination, bowel and bladder habits, L hip pain. Patient able to achieve sustained inhalation for 5 count during today's session and responded positively to active interventions. Patient will benefit from continued skilled therapeutic intervention to address remaining deficits in PFM strength, PFM coordination, bowel and bladder habits, L hip pain in order to increase function and improve overall QOL. ? ? ? ? ? ? ? ? ? PT Long Term Goals - 01/20/22 1430   ? ?  ? PT LONG TERM GOAL #1  ? Title Patient will report decreased reliance on protective undergarments as indicated by < 3/24 hour period to demonstrate improved bladder control and allow for increased participation in activities outside of the home.   ? Baseline IE: 3/night, 2/day   ? Time 12   ? Period Weeks   ? Status New   ? Target Date 04/14/22   ?  ? PT LONG TERM GOAL #2  ? Title Patient will demonstrate improved function as evidenced by a score of 58 on FOTO Urinary measure for full participation in activities at home and in the community.   ? Baseline IE: 51   ? Time 12   ? Period Weeks   ? Status New   ? Target Date 04/14/22   ?  ? PT LONG TERM GOAL #3  ? Title Patient will demonstrate improved function as evidenced by a score of 62 on FOTO Bowel measure for full participation in activities at home and in the community.   ? Baseline IE: 53   ? Time 12   ? Period Weeks   ? Status New   ? Target Date 04/14/22   ?  ? PT LONG TERM GOAL #4  ? Title Patient will demonstrate circumferential and sequential contraction of >4/5 MMT, > 6 sec hold x10 and 5 consecutive quick flicks with </= 10 min rest between testing bouts, and relaxation of the PFM coordinated with breath for improved management of  intra-abdominal pressure and normal bowel and bladder function without the presence of pain nor incontinence in order to improve participation at home and in the community.   ? Baseline IE: not assessed   ? Time 12   ? Period Weeks   ? Status New   ? Target Date 04/14/22   ? ?  ?  ? ?  ? ? ? ? ? ? ? ? Plan - 02/03/22 1117   ? ? Clinical Impression Statement Patient presents to clinic with excellent motivation to participate in therapy. Patient demonstrates deficits in PFM strength, PFM coordination, bowel and bladder habits, L hip pain. Patient able to achieve sustained inhalation for 5 count during  today's session and responded positively to active interventions. Patient will benefit from continued skilled therapeutic intervention to address remaining deficits in PFM strength, PFM coordination, bowel and bladder habits, L hip pain in order to increase function and improve overall QOL.   ? Personal Factors and Comorbidities Age;Comorbidity 3+;Behavior Pattern;Time since onset of injury/illness/exacerbation   ? Comorbidities anemia, CKD, DM, GERD, HTN, hypercholesteremia   ? Examination-Activity Limitations Bed Mobility;Bend;Carry;Continence;Lift;Stand;Toileting;Squat;Sit   ? Examination-Participation Restrictions Community Activity;Driving;Laundry;Yard Work;Cleaning   ? Stability/Clinical Decision Making Evolving/Moderate complexity   ? Rehab Potential Fair   ? PT Frequency 1x / week   ? PT Duration 12 weeks   ? PT Treatment/Interventions ADLs/Self Care Home Management;Cryotherapy;Moist Heat;Neuromuscular re-education;Therapeutic exercise;Therapeutic activities;Patient/family education;Manual techniques;Electrical Stimulation;Orthotic Fit/Training;Scar mobilization;Taping   ? PT Next Visit Plan physical assessment   ? PT Home Exercise Plan [email or web address]   ? Consulted and Agree with Plan of Care Patient   ? ?  ?  ? ?  ? ? ?Patient will benefit from skilled therapeutic intervention in order to improve the following  deficits and impairments:  Decreased endurance, Decreased activity tolerance, Decreased strength, Decreased coordination, Improper body mechanics, Pain ? ?Visit Diagnosis: ?Muscle weakness (generalized) ? ?Other lack of coordination ? ?Pain

## 2022-02-04 ENCOUNTER — Encounter: Payer: Commercial Managed Care - PPO | Admitting: Physical Therapy

## 2022-02-06 ENCOUNTER — Encounter: Payer: Commercial Managed Care - PPO | Admitting: Physical Therapy

## 2022-02-10 ENCOUNTER — Ambulatory Visit: Payer: Medicare Other | Attending: Neurosurgery | Admitting: Physical Therapy

## 2022-02-10 ENCOUNTER — Encounter: Payer: Self-pay | Admitting: Physical Therapy

## 2022-02-10 DIAGNOSIS — R278 Other lack of coordination: Secondary | ICD-10-CM | POA: Insufficient documentation

## 2022-02-10 DIAGNOSIS — M6281 Muscle weakness (generalized): Secondary | ICD-10-CM | POA: Diagnosis present

## 2022-02-10 DIAGNOSIS — M25552 Pain in left hip: Secondary | ICD-10-CM | POA: Insufficient documentation

## 2022-02-10 NOTE — Therapy (Signed)
?OUTPATIENT PHYSICAL THERAPY TREATMENT NOTE ? ? ?Patient Name: Julie Chang ?MRN: [id number] ?DOB:25-Apr-1946, 76 y.o., female ?Today's Date: 02/10/2022 ? ?PCP: Sherre Scarlet, PA-C ?REFERRING PROVIDER: Meade Maw, MD ? ? PT End of Session - 02/10/22 1121   ? ? Visit Number 3   ? Number of Visits 12   ? Date for PT Re-Evaluation 04/14/22   ? Authorization Type IE 01/20/2022   ? PT Start Time 1115   ? PT Stop Time 1155   ? PT Time Calculation (min) 40 min   ? Activity Tolerance Patient tolerated treatment well   ? Behavior During Therapy [facility] for tasks assessed/performed   ? ?  ?  ? ?  ? ? ?Past Medical History:  ?Diagnosis Date  ? Anemia   ? Arthritis   ? 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 Chronic kidney disease   ? Diabetes mellitus without complication ([place])   ? GERD (gastroesophageal reflux disease)   ? Hypercholesteremia   ? Hypertension   ? Neuromuscular disorder ([place])   ? spinal stenosis  ? ?Past Surgical History:  ?Procedure Laterality Date  ? APPENDECTOMY    ? COLONOSCOPY    ? ESOPHAGOGASTRODUODENOSCOPY    ? KNEE ARTHROPLASTY    ? right  ? LUMBAR LAMINECTOMY/DECOMPRESSION MICRODISCECTOMY N/A 12/13/2021  ? Procedure: L3-5 POSTERIOR SPINAL DECOMPRESSION;  Surgeon: Meade Maw, MD;  Location: [facility] ORS;  Service: Neurosurgery;  Laterality: N/A;  ? SHOULDER ARTHROSCOPY WITH ROTATOR CUFF REPAIR    ? right  ? TOTAL KNEE ARTHROPLASTY    ? ?Patient Active Problem List  ? Diagnosis Date Noted  ? Lumbar radiculopathy 12/13/2021  ? ? ?REFERRING DIAG: R39.15 (ICD-10-CM) - Urgency of urination ?R15.2 (ICD-10-CM) - Fecal urgency ? ? ? ?THERAPY DIAG:  ?Muscle weakness (generalized) ? ?Other lack of coordination ? ?Pain in left hip ? ? ?SUBJECTIVE: Patient notes that she is not having a good day because of R piriformis syndrome. Patient had a back spasm upon waking this morning as well. She took medication for the back spasm which has made her a bit drowsy.  ? ?PAIN:  ?Are you having pain? Yes: NPRS scale:  2-5/10 ?Pain location: inferior gluteal fold R ?TREATMENT ? ?Manual Therapy: ?STM and TPR performed to R gluteal and hamstring mm to allow for decreased tension and pain and improved posture and function ?Sacral mobilization on R to allow for improved mobility and function ?Sacral Mobilizations with movement for decreased spasm and improved mobility, grade II/III  ? ?Neuromuscular Re-education: ?Standing hamstring stretch at chair for improved posture and pain ?Standing glute stretch at chair for improved posture and pain ?Patient education on pelvic anatomy for improved understanding of relationship between R hip pain and PFM function. ? ?Therapeutic Exercise: ? ? ?Treatments unbilled: ? ?Post-treatment assessment: ? ?Patient educated throughout session on appropriate technique and form using multi-modal cueing, HEP, and activity modification. Patient articulated understanding and returned demonstration. ? ?Patient Response to interventions: ?Notes no change in pain. ? ?ASSESSMENT ?Patient presents to clinic with excellent motivation to participate in therapy. Patient demonstrates deficits in PFM strength, PFM coordination, bowel and bladder habits, L hip pain. Patient had no response to manual interventions during today's session but responded modestly/positively to active interventions. Patient will benefit from continued skilled therapeutic intervention to address remaining deficits in PFM strength, PFM coordination, bowel and bladder habits, L hip pain in order to increase function and improve overall QOL. ? ? ? PT Long Term Goals  ? ?  ?  PT LONG TERM GOAL #1  ? Title Patient will report decreased reliance on protective undergarments as indicated by < 3/24 hour period to demonstrate improved bladder control and allow for increased participation in activities outside of the home.   ? Baseline IE: 3/night, 2/day   ? Time 12   ? Period Weeks   ? Status New   ? Target Date 04/14/22   ?  ? PT LONG TERM GOAL #2  ?  Title Patient will demonstrate improved function as evidenced by a score of 58 on FOTO Urinary measure for full participation in activities at home and in the community.   ? Baseline IE: 51   ? Time 12   ? Period Weeks   ? Status New   ? Target Date 04/14/22   ?  ? PT LONG TERM GOAL #3  ? Title Patient will demonstrate improved function as evidenced by a score of 62 on FOTO Bowel measure for full participation in activities at home and in the community.   ? Baseline IE: 44   ? Time 12   ? Period Weeks   ? Status New   ? Target Date 04/14/22   ?  ? PT LONG TERM GOAL #4  ? Title Patient will demonstrate circumferential and sequential contraction of >4/5 MMT, > 6 sec hold x10 and 5 consecutive quick flicks with </= 10 min rest between testing bouts, and relaxation of the PFM coordinated with breath for improved management of intra-abdominal pressure and normal bowel and bladder function without the presence of pain nor incontinence in order to improve participation at home and in the community.   ? Baseline IE: not assessed   ? Time 12   ? Period Weeks   ? Status New   ? Target Date 04/14/22   ? ?  ?  ? ?  ? ? ? ? Plan   ? ? Clinical Impression Statement Patient presents to clinic with excellent motivation to participate in therapy. Patient demonstrates deficits in PFM strength, PFM coordination, bowel and bladder habits, L hip pain. Patient had no response to manual interventions during today's session but responded modestly/positively to active interventions. Patient will benefit from continued skilled therapeutic intervention to address remaining deficits in PFM strength, PFM coordination, bowel and bladder habits, L hip pain in order to increase function and improve overall QOL.   ? Personal Factors and Comorbidities Age;Comorbidity 3+;Behavior Pattern;Time since onset of injury/illness/exacerbation   ? Comorbidities anemia, CKD, DM, GERD, HTN, hypercholesteremia   ? Examination-Activity Limitations Bed  Mobility;Bend;Carry;Continence;Lift;Stand;Toileting;Squat;Sit   ? Examination-Participation Restrictions Community Activity;Driving;Laundry;Yard Work;Cleaning   ? Stability/Clinical Decision Making Evolving/Moderate complexity   ? Rehab Potential Fair   ? PT Frequency 1x / week   ? PT Duration 12 weeks   ? PT Treatment/Interventions ADLs/Self Care Home Management;Cryotherapy;Moist Heat;Neuromuscular re-education;Therapeutic exercise;Therapeutic activities;Patient/family education;Manual techniques;Electrical Stimulation;Orthotic Fit/Training;Scar mobilization;Taping   ? PT Home Exercise Plan [email or web address]   ? Consulted and Agree with Plan of Care Patient   ? ?  ?  ? ?  ?  ? ? ? ?Myles Gip PT, DPT [phone]  ?02/10/2022, 11:22 AM ? ?  ? ?

## 2022-02-17 ENCOUNTER — Ambulatory Visit: Payer: Medicare Other | Admitting: Physical Therapy

## 2022-02-17 DIAGNOSIS — M6281 Muscle weakness (generalized): Secondary | ICD-10-CM

## 2022-02-17 DIAGNOSIS — R278 Other lack of coordination: Secondary | ICD-10-CM

## 2022-02-17 DIAGNOSIS — M25552 Pain in left hip: Secondary | ICD-10-CM

## 2022-02-17 NOTE — Therapy (Signed)
?OUTPATIENT PHYSICAL THERAPY TREATMENT NOTE ? ? ?Patient Name: Julie Chang ?MRN: [id number] ?DOB:July 16, 1946, 76 y.o., female ?Today's Date: 02/17/2022 ? ?PCP: Sherre Scarlet, PA-C ?REFERRING PROVIDER: Meade Maw, MD ? ? PT End of Session - 02/17/22 1119   ? ? Visit Number 4   ? Number of Visits 12   ? Date for PT Re-Evaluation 04/14/22   ? Authorization Type IE 01/20/2022   ? PT Start Time 1115   ? PT Stop Time 1155   ? PT Time Calculation (min) 40 min   ? Activity Tolerance Patient tolerated treatment well   ? Behavior During Therapy [facility] for tasks assessed/performed   ? ?  ?  ? ?  ? ? ?Past Medical History:  ?Diagnosis Date  ? Anemia   ? Arthritis   ? Cancer [facility])   ? basal cell  ? Chronic kidney disease   ? Diabetes mellitus without complication ([place])   ? GERD (gastroesophageal reflux disease)   ? Hypercholesteremia   ? Hypertension   ? Neuromuscular disorder ([place])   ? spinal stenosis  ? ?Past Surgical History:  ?Procedure Laterality Date  ? APPENDECTOMY    ? COLONOSCOPY    ? ESOPHAGOGASTRODUODENOSCOPY    ? KNEE ARTHROPLASTY    ? right  ? LUMBAR LAMINECTOMY/DECOMPRESSION MICRODISCECTOMY N/A 12/13/2021  ? Procedure: L3-5 POSTERIOR SPINAL DECOMPRESSION;  Surgeon: Meade Maw, MD;  Location: [facility] ORS;  Service: Neurosurgery;  Laterality: N/A;  ? SHOULDER ARTHROSCOPY WITH ROTATOR CUFF REPAIR    ? right  ? TOTAL KNEE ARTHROPLASTY    ? ?Patient Active Problem List  ? Diagnosis Date Noted  ? Lumbar radiculopathy 12/13/2021  ? ? ?REFERRING DIAG: R39.15 (ICD-10-CM) - Urgency of urination ?R15.2 (ICD-10-CM) - Fecal urgency ? ? ? ?THERAPY DIAG:  ?Muscle weakness (generalized) ? ?Other lack of coordination ? ?Pain in left hip ? ? ?SUBJECTIVE: Patient states that she has been to see chiropractor for R hip. Patient notes that she has not had much time to do stretches as she was busy with grandkids and the holiday. Patient reports that she is not sure if she is getting any benefit from the breathing  exercises.  ? ?PAIN:  ?Are you having pain? Yes: NPRS scale: 3/10 ?Pain location: inferior gluteal fold R ?TREATMENT ? ?Manual Therapy: ? ? ?Neuromuscular Re-education: ?Patient performed the following postural stabilization activities and PFM control: ?- Seated Pelvic Tilts  - 10 reps ?- Seated March  - 10 reps ?- Seated Shoulder Flexion Full Range Single Arm  - 10 reps ?- Seated Marching with Opposite Shoulder Flexion  - 10 reps ?- Seated Pelvic Floor Contraction with Isometric Hip Adduction  - 10 reps ? ?Therapeutic Exercise: ? ? ?Treatments unbilled: ?Cryotherapy to R hip at end of session ? ? ?Patient educated throughout session on appropriate technique and form using multi-modal cueing, HEP, and activity modification. Patient articulated understanding and returned demonstration. ? ?Patient Response to interventions: ?Comfortable to return in 2 -3 weeks for progression/follow up ? ? ? ? ? PT Long Term Goals  ? ?  ? PT LONG TERM GOAL #1  ? Title Patient will report decreased reliance on protective undergarments as indicated by < 3/24 hour period to demonstrate improved bladder control and allow for increased participation in activities outside of the home.   ? Baseline IE: 3/night, 2/day   ? Time 12   ? Period Weeks   ? Status New   ? Target Date 04/14/22   ?  ?  PT LONG TERM GOAL #2  ? Title Patient will demonstrate improved function as evidenced by a score of 58 on FOTO Urinary measure for full participation in activities at home and in the community.   ? Baseline IE: 51   ? Time 12   ? Period Weeks   ? Status New   ? Target Date 04/14/22   ?  ? PT LONG TERM GOAL #3  ? Title Patient will demonstrate improved function as evidenced by a score of 62 on FOTO Bowel measure for full participation in activities at home and in the community.   ? Baseline IE: 11   ? Time 12   ? Period Weeks   ? Status New   ? Target Date 04/14/22   ?  ? PT LONG TERM GOAL #4  ? Title Patient will demonstrate circumferential and  sequential contraction of >4/5 MMT, > 6 sec hold x10 and 5 consecutive quick flicks with </= 10 min rest between testing bouts, and relaxation of the PFM coordinated with breath for improved management of intra-abdominal pressure and normal bowel and bladder function without the presence of pain nor incontinence in order to improve participation at home and in the community.   ? Baseline IE: not assessed   ? Time 12   ? Period Weeks   ? Status New   ? Target Date 04/14/22   ? ?  ?  ? ?  ? ? ? ? Plan   ? ? Clinical Impression Statement Patient presents to clinic with excellent motivation to participate in therapy. Patient demonstrates deficits in PFM strength, PFM coordination, bowel and bladder habits, L hip pain. Patient with excellent deep core activation with seated postural stabilization interventions during today's session and responded positively to active interventions. Patient will benefit from continued skilled therapeutic intervention to address remaining deficits in PFM strength, PFM coordination, bowel and bladder habits, L hip pain in order to increase function and improve overall QOL.   ? Personal Factors and Comorbidities Age;Comorbidity 3+;Behavior Pattern;Time since onset of injury/illness/exacerbation   ? Comorbidities anemia, CKD, DM, GERD, HTN, hypercholesteremia   ? Examination-Activity Limitations Bed Mobility;Bend;Carry;Continence;Lift;Stand;Toileting;Squat;Sit   ? Examination-Participation Restrictions Community Activity;Driving;Laundry;Yard Work;Cleaning   ? Stability/Clinical Decision Making Evolving/Moderate complexity   ? Rehab Potential Fair   ? PT Frequency 1x / week   ? PT Duration 12 weeks   ? PT Treatment/Interventions ADLs/Self Care Home Management;Cryotherapy;Moist Heat;Neuromuscular re-education;Therapeutic exercise;Therapeutic activities;Patient/family education;Manual techniques;Electrical Stimulation;Orthotic Fit/Training;Scar mobilization;Taping   ? PT Home Exercise Plan  [email or web address]   ? Consulted and Agree with Plan of Care Patient   ? ?  ?  ? ?  ?  ? ? ? ?Myles Gip PT, DPT [phone]  ?02/17/2022, 11:20 AM ? ?  ? ?

## 2022-02-24 ENCOUNTER — Encounter: Payer: Commercial Managed Care - PPO | Admitting: Physical Therapy

## 2022-03-03 ENCOUNTER — Encounter: Payer: Commercial Managed Care - PPO | Admitting: Physical Therapy

## 2022-03-10 ENCOUNTER — Encounter: Payer: Commercial Managed Care - PPO | Admitting: Physical Therapy

## 2022-03-10 ENCOUNTER — Ambulatory Visit: Payer: Medicare Other | Admitting: Physical Therapy

## 2022-03-17 ENCOUNTER — Ambulatory Visit: Payer: Medicare Other | Attending: Neurosurgery | Admitting: Physical Therapy

## 2022-03-17 ENCOUNTER — Encounter: Payer: Self-pay | Admitting: Physical Therapy

## 2022-03-17 DIAGNOSIS — R278 Other lack of coordination: Secondary | ICD-10-CM | POA: Insufficient documentation

## 2022-03-17 DIAGNOSIS — M6281 Muscle weakness (generalized): Secondary | ICD-10-CM | POA: Insufficient documentation

## 2022-03-17 DIAGNOSIS — M25552 Pain in left hip: Secondary | ICD-10-CM | POA: Diagnosis present

## 2022-03-17 NOTE — Patient Instructions (Signed)
Access Code: [email or web address] ?URL: [email or web address] ?Date: 03/17/2022 ?Prepared by: Myles Gip ? ?Exercises ?- Reclined Diaphragmatic Breathing  - 15-30 reps ?- Seated Diastasis Recti Correction  - 5-15 reps ?- Abdominal Correction with Gentle Compression  - 5-15 reps ?- Seated Pelvic Tilts  - 10 reps ?- Seated March  - 10 reps ?- Seated Shoulder Flexion Full Range Single Arm  - 10 reps ?- Seated Marching with Opposite Shoulder Flexion  - 10 reps ?- Seated Pelvic Floor Contraction with Isometric Hip Adduction  - 10 reps ?

## 2022-03-17 NOTE — Therapy (Signed)
?OUTPATIENT PHYSICAL THERAPY TREATMENT NOTE ? ? ?Patient Name: Julie Chang ?MRN: [id number] ?DOB:1945/11/14, 76 y.o., female ?Today's Date: 03/17/2022 ? ?PCP: Sherre Scarlet, PA-C ?REFERRING PROVIDER: Meade Maw, MD ? ? PT End of Session - 03/17/22 1117   ? ? Visit Number 5   ? Number of Visits 12   ? Date for PT Re-Evaluation 04/14/22   ? Authorization Type IE 01/20/2022   ? PT Start Time 1115   ? PT Stop Time 1150   ? PT Time Calculation (min) 35 min   ? Activity Tolerance Patient tolerated treatment well   ? Behavior During Therapy [facility] for tasks assessed/performed   ? ?  ?  ? ?  ? ? ?Past Medical History:  ?Diagnosis Date  ? Anemia   ? Arthritis   ? 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 Chronic kidney disease   ? Diabetes mellitus without complication ([place])   ? GERD (gastroesophageal reflux disease)   ? Hypercholesteremia   ? Hypertension   ? Neuromuscular disorder ([place])   ? spinal stenosis  ? ?Past Surgical History:  ?Procedure Laterality Date  ? APPENDECTOMY    ? COLONOSCOPY    ? ESOPHAGOGASTRODUODENOSCOPY    ? KNEE ARTHROPLASTY    ? right  ? LUMBAR LAMINECTOMY/DECOMPRESSION MICRODISCECTOMY N/A 12/13/2021  ? Procedure: L3-5 POSTERIOR SPINAL DECOMPRESSION;  Surgeon: Meade Maw, MD;  Location: [facility] ORS;  Service: Neurosurgery;  Laterality: N/A;  ? SHOULDER ARTHROSCOPY WITH ROTATOR CUFF REPAIR    ? right  ? TOTAL KNEE ARTHROPLASTY    ? ?Patient Active Problem List  ? Diagnosis Date Noted  ? Lumbar radiculopathy 12/13/2021  ? ? ?REFERRING DIAG: R39.15 (ICD-10-CM) - Urgency of urination ?R15.2 (ICD-10-CM) - Fecal urgency ? ? ? ?THERAPY DIAG:  ?Muscle weakness (generalized) ? ?Other lack of coordination ? ?Pain in left hip ? ? ?SUBJECTIVE: Patient notes that she has not seen any significant improvement and as a result would like to discontinue PT until follow up with GYN.  ? ?PAIN:  ?Are you having pain? Yes: NPRS scale: 3/10 ?Pain location: inferior gluteal fold R ?TREATMENT ? ?Manual  Therapy: ? ? ?Neuromuscular Re-education: ?Reassessed goals; see below. ? ?Therapeutic Exercise: ? ? ?Treatments unbilled: ? ? ? ?Patient educated throughout session on appropriate technique and form using multi-modal cueing, HEP, and activity modification. Patient articulated understanding and returned demonstration. ? ?Patient Response to interventions: ? ? ? ? ? ? PT Long Term Goals  ? ?  ? PT LONG TERM GOAL #1  ? Title Patient will report decreased reliance on protective undergarments as indicated by < 3/24 hour period to demonstrate improved bladder control and allow for increased participation in activities outside of the home.   ? Baseline IE: 3/night, 2/day; 5/8: 3/night, 1-2/day  ? Time 12   ? Period Weeks   ? Status Not Met   ? Target Date 04/14/22   ?  ? PT LONG TERM GOAL #2  ? Title Patient will demonstrate improved function as evidenced by a score of 58 on FOTO Urinary measure for full participation in activities at home and in the community.   ? Baseline IE: 51; 5/8: 41  ? Time 12   ? Period Weeks   ? Status Not Met   ? Target Date 04/14/22   ?  ? PT LONG TERM GOAL #3  ? Title Patient will demonstrate improved function as evidenced by a score of 62 on FOTO Bowel measure for full participation  in activities at home and in the community.   ? Baseline IE: 8; 5/8: 56  ? Time 12   ? Period Weeks   ? Status Not Met   ? Target Date 04/14/22   ?  ? PT LONG TERM GOAL #4  ? Title Patient will demonstrate circumferential and sequential contraction of >4/5 MMT, > 6 sec hold x10 and 5 consecutive quick flicks with </= 10 min rest between testing bouts, and relaxation of the PFM coordinated with breath for improved management of intra-abdominal pressure and normal bowel and bladder function without the presence of pain nor incontinence in order to improve participation at home and in the community.   ? Baseline IE: not assessed; 5/8: 4/5 MMT  ? Time 12   ? Period Weeks   ? Status New   ? Target Date 04/14/22   ? ?   ?  ? ?  ? ? ? ? Plan   ? ? Clinical Impression Statement Patient presents to clinic with limited motivation to participate in therapy and states that with time constraints and a busy schedule, she has not had time to devote to HEP and is seeing limited/negligible benefit at this time. Patient continues to demonstrate deficits in PFM strength, PFM coordination, bowel and bladder habits, L hip pain. Patient may benefit from continued skilled therapeutic intervention to address remaining deficits in PFM strength, PFM coordination, bowel and bladder habits, L hip pain in order to increase function and improve overall QOL.   ? Personal Factors and Comorbidities Age;Comorbidity 3+;Behavior Pattern;Time since onset of injury/illness/exacerbation   ? Comorbidities anemia, CKD, DM, GERD, HTN, hypercholesteremia   ? Examination-Activity Limitations Bed Mobility;Bend;Carry;Continence;Lift;Stand;Toileting;Squat;Sit   ? Examination-Participation Restrictions Community Activity;Driving;Laundry;Yard Work;Cleaning   ? Stability/Clinical Decision Making Evolving/Moderate complexity   ? Rehab Potential Fair   ? PT Frequency 1x / week   ? PT Duration 12 weeks   ? PT Treatment/Interventions ADLs/Self Care Home Management;Cryotherapy;Moist Heat;Neuromuscular re-education;Therapeutic exercise;Therapeutic activities;Patient/family education;Manual techniques;Electrical Stimulation;Orthotic Fit/Training;Scar mobilization;Taping   ? PT Home Exercise Plan [id number]   ? Consulted and Agree with Plan of Care Patient   ? ?  ?  ? ?  ?  ? ? ? Myles Gip PT, DPT [phone]  ?03/17/2022, 1:51 PM ? ?  ? ?

## 2022-03-24 ENCOUNTER — Encounter: Payer: Commercial Managed Care - PPO | Admitting: Physical Therapy

## 2022-04-14 ENCOUNTER — Encounter: Payer: Commercial Managed Care - PPO | Admitting: Physical Therapy

## 2022-10-08 LAB — COLOGUARD: COLOGUARD: POSITIVE — AB

## 2022-10-14 ENCOUNTER — Other Ambulatory Visit: Payer: Self-pay | Admitting: Orthopedic Surgery

## 2022-10-14 DIAGNOSIS — M67411 Ganglion, right shoulder: Secondary | ICD-10-CM

## 2022-10-14 DIAGNOSIS — Z9889 Other specified postprocedural states: Secondary | ICD-10-CM

## 2022-10-14 DIAGNOSIS — M19011 Primary osteoarthritis, right shoulder: Secondary | ICD-10-CM

## 2022-10-29 ENCOUNTER — Ambulatory Visit
Admission: RE | Admit: 2022-10-29 | Discharge: 2022-10-29 | Disposition: A | Discharge status: Home / Self Care | Payer: Medicare Other | Source: Ambulatory Visit | Attending: Orthopedic Surgery | Admitting: Orthopedic Surgery

## 2022-10-29 DIAGNOSIS — M67411 Ganglion, right shoulder: Secondary | ICD-10-CM | POA: Insufficient documentation

## 2022-10-29 DIAGNOSIS — Z9889 Other specified postprocedural states: Secondary | ICD-10-CM | POA: Diagnosis present

## 2022-10-29 DIAGNOSIS — M19011 Primary osteoarthritis, right shoulder: Secondary | ICD-10-CM | POA: Diagnosis present

## 2022-12-21 IMAGING — RF DG LUMBAR SPINE 2-3V
1 series · 4 of 4 positions shown · non-contrast
Comparison: None.

CLINICAL DATA: Lumbar decompression

EXAM:
LUMBAR SPINE - 2-3 VIEW

[Series 1: dg x-ray · 0.20mm/px · 4 of 4 slices shown]
[im 1/4]
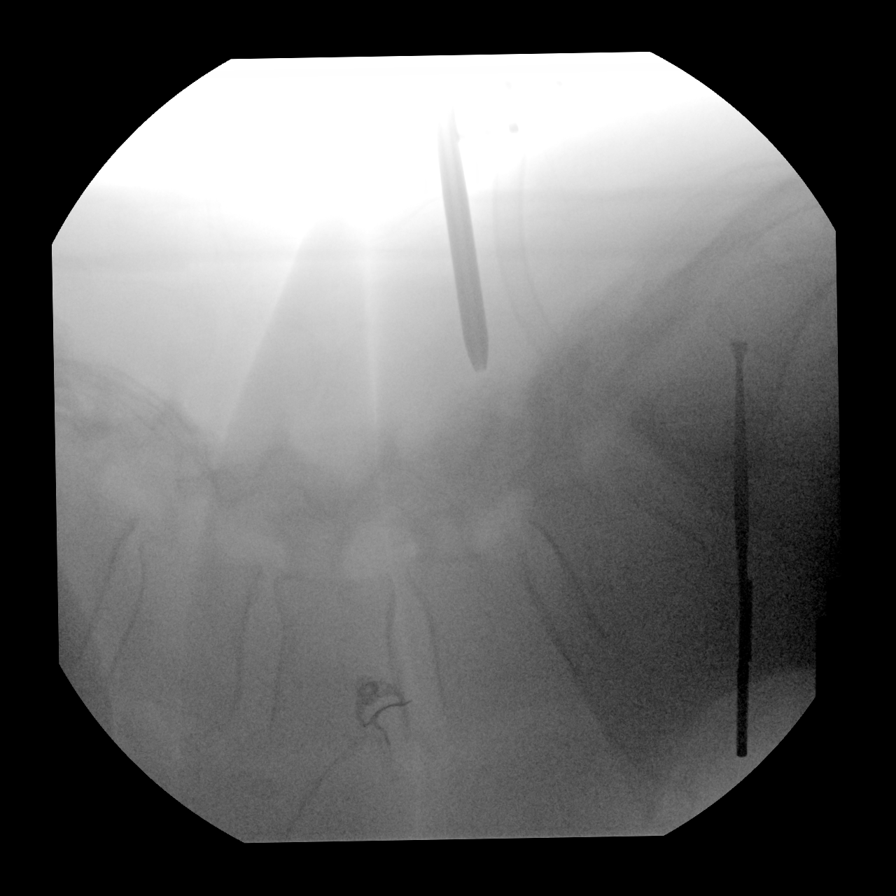
[im 2/4]
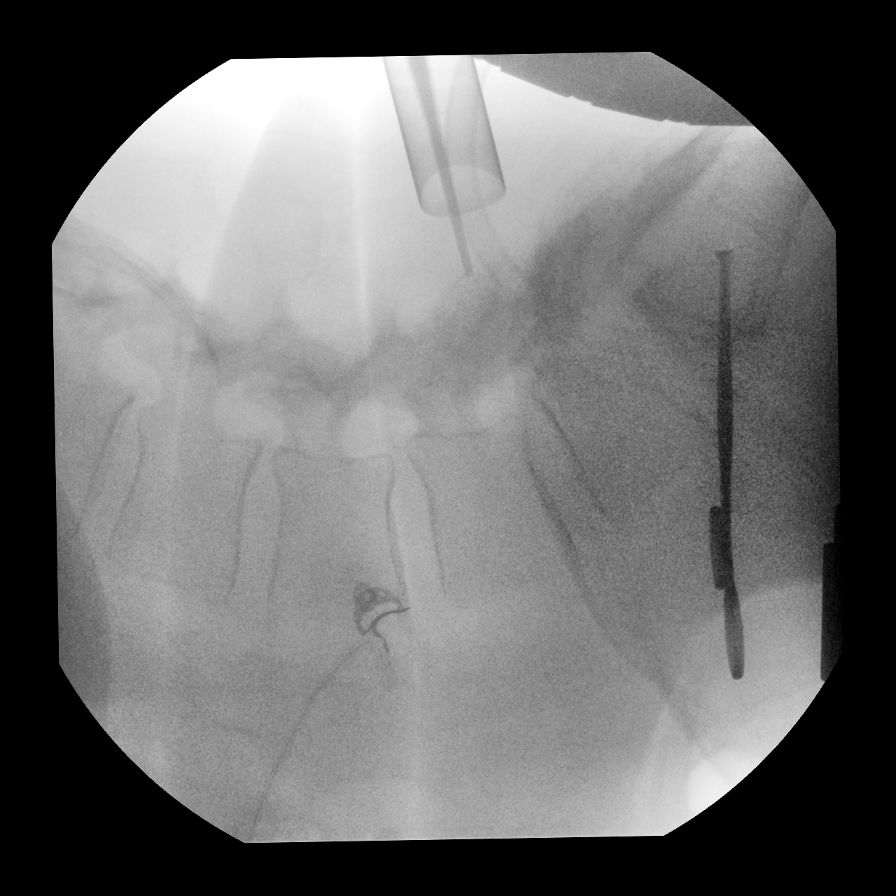
[im 3/4]
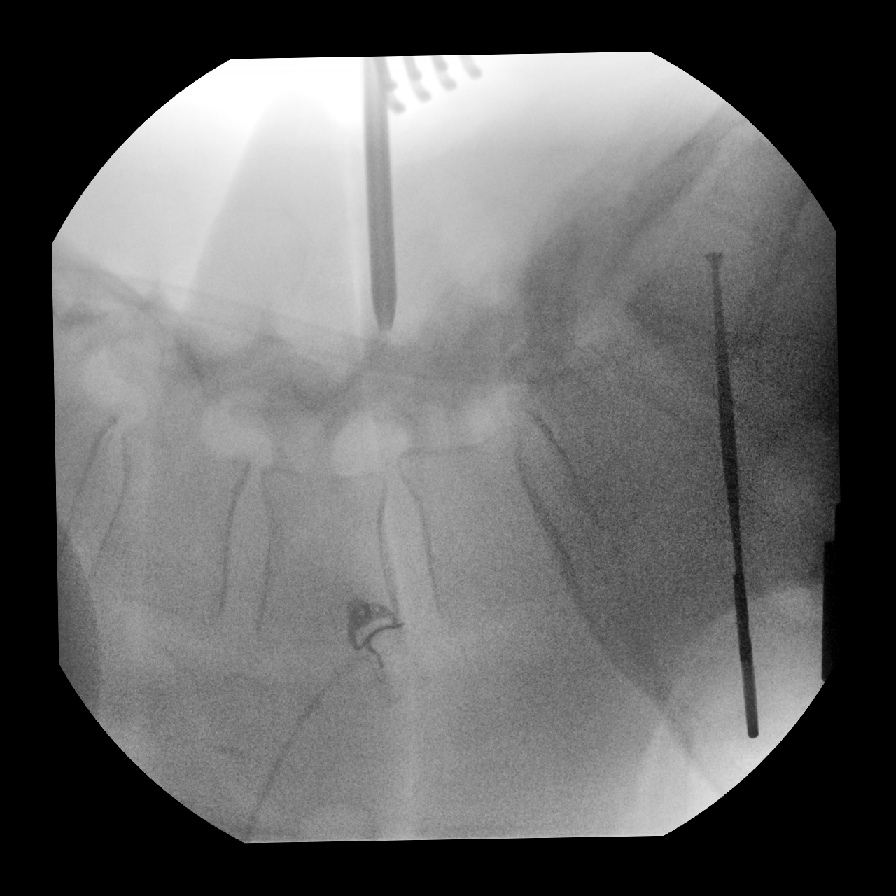
[im 4/4]
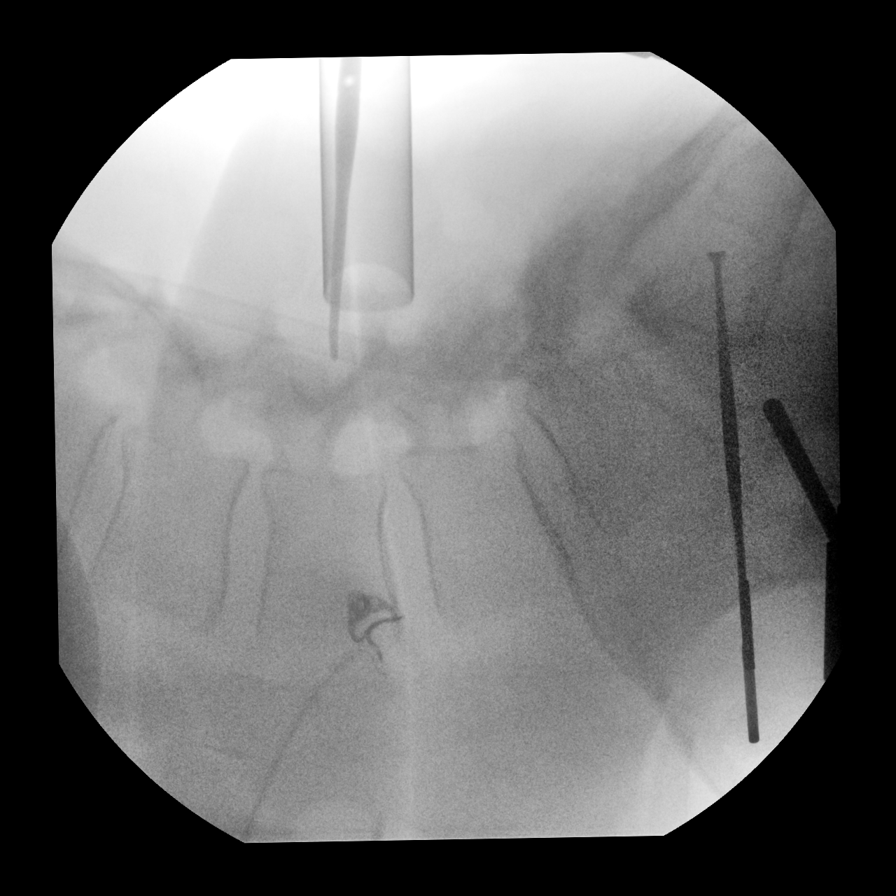

[4 of 4 positions shown; findings below may reference images not displayed]

FINDINGS: Intraoperative images demonstrate instrumentation dorsal to the
L4-L5 level and subsequently the L3-L4 level.
IMPRESSION: Intraoperative fluoroscopic guidance.

## 2023-11-02 ENCOUNTER — Other Ambulatory Visit: Payer: Self-pay | Admitting: Physician Assistant

## 2023-11-02 DIAGNOSIS — Z780 Asymptomatic menopausal state: Secondary | ICD-10-CM

## 2023-12-11 ENCOUNTER — Other Ambulatory Visit: Payer: Self-pay | Admitting: Orthopedic Surgery

## 2023-12-15 ENCOUNTER — Other Ambulatory Visit: Payer: Self-pay | Admitting: Orthopedic Surgery

## 2023-12-15 DIAGNOSIS — M19011 Primary osteoarthritis, right shoulder: Secondary | ICD-10-CM

## 2023-12-17 ENCOUNTER — Ambulatory Visit
Admission: RE | Admit: 2023-12-17 | Discharge: 2023-12-17 | Disposition: A | Discharge status: Home / Self Care | Payer: Medicare Other | Source: Ambulatory Visit | Attending: Orthopedic Surgery | Admitting: Orthopedic Surgery

## 2023-12-17 DIAGNOSIS — M19011 Primary osteoarthritis, right shoulder: Secondary | ICD-10-CM | POA: Diagnosis present

## 2023-12-22 ENCOUNTER — Encounter
Admission: RE | Admit: 2023-12-22 | Discharge: 2023-12-22 | Disposition: A | Discharge status: Home / Self Care | Payer: Commercial Managed Care - PPO | Source: Ambulatory Visit | Attending: Orthopedic Surgery | Admitting: Orthopedic Surgery

## 2023-12-22 ENCOUNTER — Other Ambulatory Visit: Payer: Self-pay

## 2023-12-22 ENCOUNTER — Encounter: Payer: Self-pay | Admitting: Urgent Care

## 2023-12-22 VITALS — BP 125/83 | HR 88 | Resp 18 | Ht 62.0 in | Wt 208.0 lb

## 2023-12-22 DIAGNOSIS — I498 Other specified cardiac arrhythmias: Secondary | ICD-10-CM | POA: Insufficient documentation

## 2023-12-22 DIAGNOSIS — Z01818 Encounter for other preprocedural examination: Secondary | ICD-10-CM | POA: Insufficient documentation

## 2023-12-22 DIAGNOSIS — Z01810 Encounter for preprocedural cardiovascular examination: Secondary | ICD-10-CM | POA: Diagnosis not present

## 2023-12-22 DIAGNOSIS — E119 Type 2 diabetes mellitus without complications: Secondary | ICD-10-CM

## 2023-12-22 HISTORY — DX: Long term (current) use of aspirin: Z79.82

## 2023-12-22 HISTORY — DX: Spinal stenosis, site unspecified: M48.00

## 2023-12-22 HISTORY — DX: Basal cell carcinoma of skin, unspecified: C44.91

## 2023-12-22 LAB — COMPREHENSIVE METABOLIC PANEL
ALT: 18 U/L (ref 0–44)
AST: 22 U/L (ref 15–41)
Albumin: 4.2 g/dL (ref 3.5–5.0)
Alkaline Phosphatase: 46 U/L (ref 38–126)
Anion gap: 10 (ref 5–15)
BUN: 20 mg/dL (ref 8–23)
CO2: 27 mmol/L (ref 22–32)
Calcium: 9.4 mg/dL (ref 8.9–10.3)
Chloride: 99 mmol/L (ref 98–111)
Creatinine, Ser: 0.79 mg/dL (ref 0.44–1.00)
GFR, Estimated: >60 mL/min (ref >60)
Glucose, Bld: 95 mg/dL (ref 70–99)
Potassium: 4 mmol/L (ref 3.5–5.1)
Sodium: 136 mmol/L (ref 135–145)
Total Bilirubin: 0.5 mg/dL (ref 0.0–1.2)
Total Protein: 8.1 g/dL (ref 6.5–8.1)

## 2023-12-22 LAB — TYPE AND SCREEN
ABO/RH(D): A POS
Antibody Screen: NEGATIVE

## 2023-12-22 LAB — CBC WITH DIFFERENTIAL/PLATELET
Abs Immature Granulocytes: 0.03 10*3/uL (ref 0.00–0.07)
Basophils Absolute: 0.1 10*3/uL (ref 0.0–0.1)
Basophils Relative: 1 %
Eosinophils Absolute: 0.3 10*3/uL (ref 0.0–0.5)
Eosinophils Relative: 3 %
HCT: 44.7 % (ref 36.0–46.0)
Hemoglobin: 14.8 g/dL (ref 12.0–15.0)
Immature Granulocytes: 0 %
Lymphocytes Relative: 36 %
Lymphs Abs: 3.6 10*3/uL (ref 0.7–4.0)
MCH: 29.4 pg (ref 26.0–34.0)
MCHC: 33.1 g/dL (ref 30.0–36.0)
MCV: 88.7 fL (ref 80.0–100.0)
Monocytes Absolute: 0.9 10*3/uL (ref 0.1–1.0)
Monocytes Relative: 9 %
Neutro Abs: 5.3 10*3/uL (ref 1.7–7.7)
Neutrophils Relative %: 51 %
Platelets: 277 10*3/uL (ref 150–400)
RBC: 5.04 MIL/uL (ref 3.87–5.11)
RDW: 12.6 % (ref 11.5–15.5)
WBC: 10.1 10*3/uL (ref 4.0–10.5)
nRBC: 0 % (ref 0.0–0.2)

## 2023-12-22 LAB — URINALYSIS, ROUTINE W REFLEX MICROSCOPIC
Bacteria, UA: NONE SEEN
Bilirubin Urine: NEGATIVE
Glucose, UA: >=500 mg/dL — AB
Hgb urine dipstick: NEGATIVE
Ketones, ur: NEGATIVE mg/dL
Leukocytes,Ua: NEGATIVE
Nitrite: NEGATIVE
Protein, ur: NEGATIVE mg/dL
RBC / HPF: 0 RBC/hpf (ref 0–5)
Specific Gravity, Urine: 1.025 (ref 1.005–1.030)
pH: 5 (ref 5.0–8.0)

## 2023-12-22 LAB — SURGICAL PCR SCREEN
MRSA, PCR: NEGATIVE
Staphylococcus aureus: NEGATIVE

## 2023-12-22 NOTE — Patient Instructions (Addendum)
Your procedure is scheduled on: Monday 12/28/23 To find out your arrival time, please call [phone] between 1PM - 3PM on:   Friday 12/25/23 Report to the Registration Desk on the 1st floor of [facility]. FREE Valet parking is available.  If your arrival time is 6:00 am, do not arrive before that time as the [facility] entrance doors do not open until 6:00 am.  REMEMBER: Instructions that are not followed completely may result in serious medical risk, up to and including death; or upon the discretion of your surgeon and anesthesiologist your surgery may need to be rescheduled.  Do not eat food after midnight the night before surgery.  No gum chewing or hard candies.  You may however, drink CLEAR liquids up to 2 hours before you are scheduled to arrive for your surgery. Do not drink anything within 2 hours of your scheduled arrival time.  Clear liquids include: - water   Type 1 and Type 2 diabetics should only drink water.  One week prior to surgery: Stop Anti-inflammatories (NSAIDS) such as Advil, Aleve, Ibuprofen, Motrin, Naproxen, Naprosyn and Aspirin based products such as Excedrin, Goody's Powder, BC Powder. You may however, continue to take Tylenol if needed for pain up until the day of surgery.  Stop ANY OVER THE COUNTER supplements and vitamins TODAY 12/22/23 until after surgery.  Continue taking all prescribed medications with the exception of the following: Dulaglutide last dost is Sunday 12/20/23, JARDIANCE last dose is Thursday 12/24/23. Metformin last dose is Friday 12/25/23.  TAKE ONLY THESE MEDICATIONS THE MORNING OF SURGERY WITH A SIP OF WATER:  none  No Alcohol for 24 hours before or after surgery.  No Smoking including e-cigarettes for 24 hours before surgery.  No chewable tobacco products for at least 6 hours before surgery.  No nicotine patches on the day of surgery.  Do not use any "recreational" drugs for at least a week (preferably 2 weeks) before  your surgery.  Please be advised that the combination of cocaine and anesthesia may have negative outcomes, up to and including death. If you test positive for cocaine, your surgery will be cancelled.  On the morning of surgery brush your teeth with toothpaste and water, you may rinse your mouth with mouthwash if you wish. Do not swallow any toothpaste or mouthwash.  Use CHG Soap or wipes as directed on instruction sheet. See Below  Do not wear lotions, powders, or perfumes. No Deodorant  Do not shave body hair from the neck down 48 hours before surgery.  Wear comfortable clothing (specific to your surgery type) to the hospital.  Do not wear jewelry, make-up, hairpins, clips or nail polish.  For welded (permanent) jewelry: bracelets, anklets, waist bands, etc.  Please have this removed prior to surgery.  If it is not removed, there is a chance that hospital personnel will need to cut it off on the day of surgery. Contact lenses, hearing aids and dentures may not be worn into surgery.  Do not bring valuables to the hospital. [facility] is not responsible for any missing/lost belongings or valuables.   Total Shoulder Arthroplasty:  use Benzoyl Peroxide 5% Gel as directed on instruction sheet.  Notify your doctor if there is any change in your medical condition (cold, fever, infection).  If you are being discharged the day of surgery, you will not be allowed to drive home. You will need a responsible individual to drive you home and stay with you for 24 hours after  surgery.   If you are taking public transportation, you will need to have a responsible individual with you.  If you are being admitted to the hospital overnight, leave your suitcase in the car. After surgery it may be brought to your room.  In case of increased patient census, it may be necessary for you, the patient, to continue your postoperative care in the Same Day Surgery department.  After surgery, you can help  prevent lung complications by doing breathing exercises.  Take deep breaths and cough every 1-2 hours. Your doctor may order a device called an Incentive Spirometer to help you take deep breaths. When coughing or sneezing, hold a pillow firmly against your incision with both hands. This is called "splinting." Doing this helps protect your incision. It also decreases belly discomfort.  Surgery Visitation Policy:  Patients undergoing a surgery or procedure may have two family members or support persons with them as long as the person is not COVID-19 positive or experiencing its symptoms.   Inpatient Visitation:    Visiting hours are 7 a.m. to 8 p.m. Up to four visitors are allowed at one time in a patient room. The visitors may rotate out with other people during the day. One designated support person (adult) may remain overnight.  Due to an increase in RSV and influenza rates and associated hospitalizations, children ages 63 and under will not be able to visit patients in [facility]. Masks continue to be strongly recommended.  Please call the Pre-admissions Testing Dept. at [phone] if you have any questions about these instructions.    Pre-operative 5 CHG Bath Instructions   You can play a key role in reducing the risk of infection after surgery. Your skin needs to be as free of germs as possible. You can reduce the number of germs on your skin by washing with CHG (chlorhexidine gluconate) soap before surgery. CHG is an antiseptic soap that kills germs and continues to kill germs even after washing.   DO NOT use if you have an allergy to chlorhexidine/CHG or antibacterial soaps. If your skin becomes reddened or irritated, stop using the CHG and notify one of our RNs at [phone].   Please shower with the CHG soap starting 4 days before surgery using the following schedule:   Thursday 12/24/23 - Monday 12/28/23    Please keep in mind the following:  DO NOT shave,  including legs and underarms, starting the day of your first shower.   You may shave your face at any point before/day of surgery.  Place clean sheets on your bed the day you start using CHG soap. Use a clean washcloth (not used since being washed) for each shower. DO NOT sleep with pets once you start using the CHG.   CHG Shower Instructions:  If you choose to wash your hair and private area, wash first with your normal shampoo/soap.  After you use shampoo/soap, rinse your hair and body thoroughly to remove shampoo/soap residue.  Turn the water OFF and apply about 3 tablespoons (45 ml) of CHG soap to a CLEAN washcloth.  Apply CHG soap ONLY FROM YOUR NECK DOWN TO YOUR TOES (washing for 3-5 minutes)  DO NOT use CHG soap on face, private areas, open wounds, or sores.  Pay special attention to the area where your surgery is being performed.  If you are having back surgery, having someone wash your back for you may be helpful. Wait 2 minutes after CHG soap is applied, then  you may rinse off the CHG soap.  Pat dry with a clean towel  Put on clean clothes/pajamas   If you choose to wear lotion, please use ONLY the CHG-compatible lotions on the back of this paper.     Additional instructions for the day of surgery: DO NOT APPLY any lotions, deodorants, cologne, or perfumes.   Put on clean/comfortable clothes.  Brush your teeth.  Ask your nurse before applying any prescription medications to the skin.      CHG Compatible Lotions   Aveeno Moisturizing lotion  Cetaphil Moisturizing Cream  Cetaphil Moisturizing Lotion  Clairol Herbal Essence Moisturizing Lotion, Dry Skin  Clairol Herbal Essence Moisturizing Lotion, Extra Dry Skin  Clairol Herbal Essence Moisturizing Lotion, Normal Skin  Curel Age Defying Therapeutic Moisturizing Lotion with Alpha Hydroxy  Curel Extreme Care Body Lotion  Curel Soothing Hands Moisturizing Hand Lotion  Curel Therapeutic Moisturizing Cream, Fragrance-Free   Curel Therapeutic Moisturizing Lotion, Fragrance-Free  Curel Therapeutic Moisturizing Lotion, Original Formula  Eucerin Daily Replenishing Lotion  Eucerin Dry Skin Therapy Plus Alpha Hydroxy Crme  Eucerin Dry Skin Therapy Plus Alpha Hydroxy Lotion  Eucerin Original Crme  Eucerin Original Lotion  Eucerin Plus Crme Eucerin Plus Lotion  Eucerin TriLipid Replenishing Lotion  Keri Anti-Bacterial Hand Lotion  Keri Deep Conditioning Original Lotion Dry Skin Formula Softly Scented  Keri Deep Conditioning Original Lotion, Fragrance Free Sensitive Skin Formula  Keri Lotion Fast Absorbing Fragrance Free Sensitive Skin Formula  Keri Lotion Fast Absorbing Softly Scented Dry Skin Formula  Keri Original Lotion  Keri Skin Renewal Lotion Keri Silky Smooth Lotion  Keri Silky Smooth Sensitive Skin Lotion  Nivea Body Creamy Conditioning Oil  Nivea Body Extra Enriched Lotion  Nivea Body Original Lotion  Nivea Body Sheer Moisturizing Lotion Nivea Crme  Nivea Skin Firming Lotion  NutraDerm 30 Skin Lotion  NutraDerm Skin Lotion  NutraDerm Therapeutic Skin Cream  NutraDerm Therapeutic Skin Lotion  ProShield Protective Hand Cream  Provon moisturizing lotion  Preparing for Total Shoulder Arthroplasty  Before surgery, you can play an important role by reducing the number of germs on your skin by using the following products:  Benzoyl Peroxide Gel  o Reduces the number of germs present on the skin  o Applied twice a day to shoulder area starting two days before surgery  Chlorhexidine Gluconate (CHG) Soap  o An antiseptic cleaner that kills germs and bonds with the skin to continue killing germs even after washing  o Used for showering the night before surgery and morning of surgery  BENZOYL PEROXIDE 5% GEL  Please do not use if you have an allergy to benzoyl peroxide. If your skin becomes reddened/irritated stop using the benzoyl peroxide.  Starting two days before surgery, apply as  follows:  1. Apply benzoyl peroxide in the morning and at night. Apply after taking a shower. If you are not taking a shower, clean entire shoulder front, back, and side along with the armpit with a clean wet washcloth.  2. Place a quarter-sized dollop on your shoulder and rub in thoroughly, making sure to cover the front, back, and side of your shoulder, along with the armpit.  2 days before ____ AM ____ PM Sat 12/26/23  1 day before ____ AM ____ PM Sun 12/27/23  3. Do this twice a day for two days. (Last application is the night before surgery, AFTER using the CHG soap).  4. Do NOT apply benzoyl peroxide gel on the day of surgery.

## 2023-12-28 ENCOUNTER — Ambulatory Visit
Admission: RE | Admit: 2023-12-28 | Payer: Commercial Managed Care - PPO | Source: Home / Self Care | Admitting: Orthopedic Surgery

## 2023-12-28 ENCOUNTER — Encounter: Admission: RE | Payer: Self-pay | Source: Home / Self Care

## 2023-12-28 SURGERY — REVERSE SHOULDER ARTHROPLASTY
Anesthesia: Choice | Site: Shoulder | Laterality: Right

## 2024-05-03 ENCOUNTER — Ambulatory Visit
Admission: RE | Admit: 2024-05-03 | Discharge: 2024-05-03 | Disposition: A | Discharge status: Home / Self Care | Source: Ambulatory Visit | Attending: Physician Assistant | Admitting: Physician Assistant

## 2024-05-03 DIAGNOSIS — Z780 Asymptomatic menopausal state: Secondary | ICD-10-CM | POA: Insufficient documentation

## 2024-06-01 ENCOUNTER — Other Ambulatory Visit: Payer: Self-pay | Admitting: Orthopedic Surgery

## 2024-06-06 ENCOUNTER — Other Ambulatory Visit

## 2024-06-14 ENCOUNTER — Ambulatory Visit: Admission: RE | Admit: 2024-06-14 | Source: Home / Self Care | Admitting: Orthopedic Surgery

## 2024-06-14 ENCOUNTER — Encounter: Admission: RE | Payer: Self-pay | Source: Home / Self Care

## 2024-06-14 SURGERY — ARTHROPLASTY, SHOULDER, TOTAL, REVERSE
Anesthesia: Choice | Site: Shoulder | Laterality: Right
